# Patient Record
Sex: Male | Born: 1985 | Race: Black or African American | Hispanic: No | Marital: Single | Smoking: Current every day smoker
Health system: Southern US, Community
[De-identification: ages and names within clinical notes are randomized; demographics above are authoritative.]

## PROBLEM LIST (undated history)

## (undated) DIAGNOSIS — K5792 Diverticulitis of intestine, part unspecified, without perforation or abscess without bleeding: Secondary | ICD-10-CM

---

## 2014-03-02 ENCOUNTER — Encounter (HOSPITAL_BASED_OUTPATIENT_CLINIC_OR_DEPARTMENT_OTHER): Payer: Self-pay | Admitting: Emergency Medicine

## 2014-03-02 ENCOUNTER — Emergency Department (HOSPITAL_BASED_OUTPATIENT_CLINIC_OR_DEPARTMENT_OTHER)
Admission: EM | Admit: 2014-03-02 | Discharge: 2014-03-03 | Disposition: A | Discharge status: Home / Self Care | Payer: Self-pay | Attending: Emergency Medicine | Admitting: Emergency Medicine

## 2014-03-02 DIAGNOSIS — F17200 Nicotine dependence, unspecified, uncomplicated: Secondary | ICD-10-CM | POA: Insufficient documentation

## 2014-03-02 DIAGNOSIS — R197 Diarrhea, unspecified: Secondary | ICD-10-CM | POA: Insufficient documentation

## 2014-03-02 DIAGNOSIS — J069 Acute upper respiratory infection, unspecified: Secondary | ICD-10-CM | POA: Insufficient documentation

## 2014-03-02 NOTE — ED Notes (Signed)
C/o sneezing, prod cough, itching throat and eyes x 1 week-chills, last night

## 2014-03-03 ENCOUNTER — Emergency Department (HOSPITAL_BASED_OUTPATIENT_CLINIC_OR_DEPARTMENT_OTHER): Payer: Self-pay

## 2014-03-03 MED ORDER — HYDROCOD POLST-CHLORPHEN POLST 10-8 MG/5ML PO LQCR: 5.0000 mL | Freq: Two times a day (BID) | ORAL | Status: DC | PRN

## 2014-03-03 MED ORDER — HYDROCOD POLST-CHLORPHEN POLST 10-8 MG/5ML PO LQCR
ORAL | Status: AC
  Filled 2014-03-03: qty 5

## 2014-03-03 MED ORDER — HYDROCOD POLST-CHLORPHEN POLST 10-8 MG/5ML PO LQCR
5.0000 mL | Freq: Once | ORAL | Status: AC
  Administered 2014-03-03: 5 mL via ORAL

## 2014-03-03 NOTE — ED Provider Notes (Signed)
CSN: [id number]     Arrival date & time 03/02/14  2122 History   First MD Initiated Contact with Patient 03/03/14 0022     Chief Complaint  Patient presents with  . Allergy Symptoms      (Consider location/radiation/quality/duration/timing/severity/associated sxs/prior Treatment) HPI This is a 28 year old male who complains of a week of "allergy symptoms" by which he means rhinorrhea, nasal congestion, itchy throat and cough. He states the cough is productive of green sputum. Yesterday he developed vaguely described dizziness, weakness and chills. He is not aware of having a fever. He had some nausea earlier that has resolved. He had 4 episodes of diarrhea yesterday. He is taking Benadryl, Alka-Seltzer and Mucinex without relief. He denies ear pain, ear discomfort or hearing change.  History reviewed. No pertinent past medical history. History reviewed. No pertinent past surgical history. No family history on file. History  Substance Use Topics  . Smoking status: Current Every Day Smoker  . Smokeless tobacco: Not on file  . Alcohol Use: Yes    Review of Systems  All other systems reviewed and are negative.   Allergies  Review of patient's allergies indicates no known allergies.  Home Medications   Prior to Admission medications   Not on File   BP 154/88  Pulse 102  Temp(Src) 98.9 F (37.2 C) (Oral)  Resp 16  Ht 5\' 7"  (1.702 m)  Wt 275 lb (124.739 kg)  BMI 43.06 kg/m2  SpO2 100%  Physical Exam General: Well-developed, well-nourished male in no acute distress; appearance consistent with age of record HENT: normocephalic; atraumatic; mild nasal congestion; pharynx normal; TMs normal Eyes: pupils equal, round and reactive to light; extraocular muscles intact Neck: supple Heart: regular rate and rhythm Lungs: clear to auscultation bilaterally Abdomen: soft; nondistended; nontender; bowel sounds present Extremities: No deformity; full range of motion; pulses  normal Neurologic: Awake, alert and oriented; motor function intact in all extremities and symmetric; no facial droop Skin: Warm and dry Psychiatric: Normal mood and affect    ED Course  Procedures (including critical care time)  MDM  Nursing notes and vitals signs, including pulse oximetry, reviewed.  Summary of this visit's results, reviewed by myself:  Imaging Studies: Dg Chest 2 View  03/03/2014   CLINICAL DATA:  Cough  EXAM: CHEST  2 VIEW  COMPARISON:  None.  FINDINGS: The heart size and mediastinal contours are within normal limits. Both lungs are clear. The visualized skeletal structures are unremarkable.  IMPRESSION: No active cardiopulmonary disease.   Electronically Signed   By: Alcide CleverMark  Lukens M.D.   On: 03/03/2014 00:45   DDx seasonal allergies vs. Viral syndrome. Will treat symptomatically.      Hanley SeamenJohn L Aubery Douthat, MD 03/03/14 [phone]

## 2014-03-03 NOTE — ED Notes (Signed)
Pt reports allergic rhinitis-like symptoms x1 week and has tried OTC medications with minimal relief - pt also reports today he began experiencing weakness and diarrhea x4 episodes today. Pt admits to dizziness while standing for extended periods of time, denies syncope - dizziness relieved by sitting down.

## 2014-03-03 NOTE — ED Notes (Signed)
Pt ambulating independently w/ steady gait on d/c in no acute distress, A&Ox4. D/c instructions reviewed w/ pt and family - pt and family deny any further questions or concerns at present. Rx given x1  

## 2015-01-26 ENCOUNTER — Emergency Department (HOSPITAL_BASED_OUTPATIENT_CLINIC_OR_DEPARTMENT_OTHER): Payer: Self-pay

## 2015-01-26 ENCOUNTER — Emergency Department (HOSPITAL_BASED_OUTPATIENT_CLINIC_OR_DEPARTMENT_OTHER)
Admission: EM | Admit: 2015-01-26 | Discharge: 2015-01-26 | Disposition: A | Discharge status: Home / Self Care | Payer: Self-pay | Attending: Emergency Medicine | Admitting: Emergency Medicine

## 2015-01-26 ENCOUNTER — Encounter (HOSPITAL_BASED_OUTPATIENT_CLINIC_OR_DEPARTMENT_OTHER): Payer: Self-pay | Admitting: *Deleted

## 2015-01-26 DIAGNOSIS — Z720 Tobacco use: Secondary | ICD-10-CM | POA: Insufficient documentation

## 2015-01-26 DIAGNOSIS — R000 Tachycardia, unspecified: Secondary | ICD-10-CM | POA: Insufficient documentation

## 2015-01-26 DIAGNOSIS — J069 Acute upper respiratory infection, unspecified: Secondary | ICD-10-CM | POA: Insufficient documentation

## 2015-01-26 LAB — BASIC METABOLIC PANEL
ANION GAP: 9 (ref 5–15)
BUN: 18 mg/dL (ref 6–23)
CO2: 25 mmol/L (ref 19–32)
Calcium: 8.8 mg/dL (ref 8.4–10.5)
Chloride: 104 mmol/L (ref 96–112)
Creatinine, Ser: 1.38 mg/dL — ABNORMAL HIGH (ref 0.50–1.35)
GFR, EST AFRICAN AMERICAN: 79 mL/min — AB (ref >90)
GFR, EST NON AFRICAN AMERICAN: 68 mL/min — AB (ref >90)
GLUCOSE: 100 mg/dL — AB (ref 70–99)
POTASSIUM: 3.7 mmol/L (ref 3.5–5.1)
SODIUM: 138 mmol/L (ref 135–145)

## 2015-01-26 LAB — CBC
HEMATOCRIT: 44.6 % (ref 39.0–52.0)
HEMOGLOBIN: 14.6 g/dL (ref 13.0–17.0)
MCH: 26.6 pg (ref 26.0–34.0)
MCHC: 32.7 g/dL (ref 30.0–36.0)
MCV: 81.4 fL (ref 78.0–100.0)
Platelets: 189 10*3/uL (ref 150–400)
RBC: 5.48 MIL/uL (ref 4.22–5.81)
RDW: 15.2 % (ref 11.5–15.5)
WBC: 8.3 10*3/uL (ref 4.0–10.5)

## 2015-01-26 LAB — RAPID STREP SCREEN (MED CTR MEBANE ONLY): Streptococcus, Group A Screen (Direct): NEGATIVE

## 2015-01-26 MED ORDER — DEXTROSE 5 % IV SOLN
500.0000 mg | Freq: Once | INTRAVENOUS | Status: AC
  Administered 2015-01-26: 500 mg via INTRAVENOUS
  Filled 2015-01-26: qty 500

## 2015-01-26 MED ORDER — AZITHROMYCIN 250 MG PO TABS: ORAL_TABLET | ORAL | Status: AC

## 2015-01-26 MED ORDER — AZITHROMYCIN 250 MG PO TABS
ORAL_TABLET | ORAL | Status: AC
  Filled 2015-01-26: qty 1

## 2015-01-26 MED ORDER — SODIUM CHLORIDE 0.9 % IV BOLUS (SEPSIS)
1000.0000 mL | Freq: Once | INTRAVENOUS | Status: AC
  Administered 2015-01-26: 1000 mL via INTRAVENOUS

## 2015-01-26 MED ORDER — ACETAMINOPHEN 500 MG PO TABS
1000.0000 mg | ORAL_TABLET | Freq: Once | ORAL | Status: AC
  Administered 2015-01-26: 1000 mg via ORAL
  Filled 2015-01-26: qty 2

## 2015-01-26 MED ORDER — IBUPROFEN 800 MG PO TABS
800.0000 mg | ORAL_TABLET | Freq: Once | ORAL | Status: AC
  Administered 2015-01-26: 800 mg via ORAL

## 2015-01-26 MED ORDER — ACETAMINOPHEN 500 MG PO TABS: 1000.0000 mg | ORAL_TABLET | Freq: Once | ORAL | Status: DC

## 2015-01-26 MED ORDER — IBUPROFEN 800 MG PO TABS
ORAL_TABLET | ORAL | Status: AC
  Filled 2015-01-26: qty 1

## 2015-01-26 MED ORDER — AZITHROMYCIN 250 MG PO TABS
500.0000 mg | ORAL_TABLET | ORAL | Status: AC
  Administered 2015-01-26: 500 mg via ORAL
  Filled 2015-01-26: qty 2

## 2015-01-26 NOTE — Discharge Instructions (Signed)
Cough, Adult   A cough is a reflex. It helps you clear your throat and airways. A cough can help heal your body. A cough can last 2 or 3 weeks (acute) or may last more than 8 weeks (chronic). Some common causes of a cough can include an infection, allergy, or a cold.  HOME CARE  · Only take medicine as told by your doctor.  · If given, take your medicines (antibiotics) as told. Finish them even if you start to feel better.  · Use a cold steam vaporizer or humidifier in your home. This can help loosen thick spit (secretions).  · Sleep so you are almost sitting up (semi-upright). Use pillows to do this. This helps reduce coughing.  · Rest as needed.  · Stop smoking if you smoke.  GET HELP RIGHT AWAY IF:  · You have yellowish-white fluid (pus) in your thick spit.  · Your cough gets worse.  · Your medicine does not reduce coughing, and you are losing sleep.  · You cough up blood.  · You have trouble breathing.  · Your pain gets worse and medicine does not help.  · You have a fever.  MAKE SURE YOU:   · Understand these instructions.  · Will watch your condition.  · Will get help right away if you are not doing well or get worse.  Document Released: 06/19/2011 Document Revised: 02/20/2014 Document Reviewed: 06/19/2011  [employer]® Patient Information ©2015 [employer]. This information is not intended to replace advice given to you by your health care provider. Make sure you discuss any questions you have with your health care provider.

## 2015-01-26 NOTE — ED Notes (Signed)
Pt c/o URi symptoms  fever cough bodyaches x 4 days

## 2015-01-26 NOTE — ED Provider Notes (Signed)
CSN: [id number]     Arrival date & time 01/26/15  2012 History   First MD Initiated Contact with Patient 01/26/15 2017     Chief Complaint  Patient presents with  . URI     (Consider location/radiation/quality/duration/timing/severity/associated sxs/prior Treatment) HPI Comments: 29 year old male complaining of generalized body aches, subjective fever and chills, productive cough with mucus and sore throat 4 days. He tried taking over-the-counter NyQuil with minimal relief. Denies chest pain or shortness of breath. Admits to nausea without vomiting. Denies diarrhea. His child is sick with pneumonia, diagnosed last night and admitted to the hospital. States he feels fatigued.  Patient is a 29 y.o. male presenting with URI. The history is provided by the patient.  URI Presenting symptoms: congestion, cough, fatigue, rhinorrhea and sore throat   Cough:    Cough characteristics:  Productive   Sputum characteristics:  Nondescript   Severity:  Moderate   Onset quality:  Gradual   Duration:  4 days   Timing:  Constant   Progression:  Unchanged   Chronicity:  New Severity:  Moderate Onset quality:  Gradual Duration:  4 days Timing:  Constant Progression:  Worsening Chronicity:  New Relieved by:  Nothing Ineffective treatments: Nyquil. Associated symptoms: arthralgias and myalgias     History reviewed. No pertinent past medical history. History reviewed. No pertinent past surgical history. History reviewed. No pertinent family history. History  Substance Use Topics  . Smoking status: Current Every Day Smoker -- 1.00 packs/day    Types: Cigarettes  . Smokeless tobacco: Not on file  . Alcohol Use: Yes    Review of Systems  Constitutional: Positive for fatigue.  HENT: Positive for congestion, rhinorrhea and sore throat.   Respiratory: Positive for cough.   Gastrointestinal: Positive for nausea.  Musculoskeletal: Positive for myalgias and arthralgias.  All other systems  reviewed and are negative.     Allergies  Review of patient's allergies indicates no known allergies.  Home Medications   Prior to Admission medications   Medication Sig Start Date End Date Taking? Authorizing Provider  azithromycin (ZITHROMAX) 250 MG tablet Take 1 tablet by mouth daily for 4 days. 01/27/15 01/30/15  Purvis SheffieldForrest Harrison, MD   BP 124/71 mmHg  Pulse 103  Temp(Src) 100.3 F (37.9 C) (Oral)  Resp 18  Ht 5\' 7"  (1.702 m)  Wt 275 lb (124.739 kg)  BMI 43.06 kg/m2  SpO2 94% Physical Exam  Constitutional: He is oriented to person, place, and time. He appears well-developed and well-nourished. No distress.  HENT:  Head: Normocephalic and atraumatic.  Nose: Mucosal edema present.  Mouth/Throat: Uvula is midline and mucous membranes are normal. Posterior oropharyngeal edema and posterior oropharyngeal erythema present. No oropharyngeal exudate.  Eyes: Conjunctivae and EOM are normal.  Neck: Normal range of motion. Neck supple.  Cardiovascular: Regular rhythm and normal heart sounds.  Tachycardia present.   Pulmonary/Chest: Effort normal and breath sounds normal.  Harsh cough present.  Musculoskeletal: Normal range of motion. He exhibits no edema.  Lymphadenopathy:    He has cervical adenopathy.  Neurological: He is alert and oriented to person, place, and time.  Skin: Skin is warm and dry.  Psychiatric: He has a normal mood and affect. His behavior is normal.  Nursing note and vitals reviewed.   ED Course  Procedures (including critical care time) Labs Review Labs Reviewed  BASIC METABOLIC PANEL - Abnormal; Notable for the following:    Glucose, Bld 100 (*)    Creatinine, Ser 1.38 (*)  GFR calc non Af Amer 68 (*)    GFR calc Af Amer 79 (*)    All other components within normal limits  RAPID STREP SCREEN  CULTURE, GROUP A STREP  CBC    Imaging Review Dg Chest 2 View  01/26/2015   CLINICAL DATA:  Initial valuation for acute fever, cough, chest pain  EXAM:  CHEST  2 VIEW  COMPARISON:  Prior radiograph from 03/03/2014  FINDINGS: The cardiac and mediastinal silhouettes are stable in size and contour, and remain within normal limits.  The lungs are normally inflated. No airspace consolidation, pleural effusion, or pulmonary edema is identified. Streaky opacity within the retrocardiac left lower lobe favored to reflect normal bronchovascular markings. There is no pneumothorax.  No acute osseous abnormality identified.  IMPRESSION: No active cardiopulmonary disease.   Electronically Signed   By: Rise Mu M.D.   On: 01/26/2015 21:30     EKG Interpretation None      MDM   Final diagnoses:  URI (upper respiratory infection)   Patient presenting with flulike symptoms. Nontoxic appearing, NAD. Temperature 103.9, tachycardic, vitals otherwise stable. Lungs sound clear, however given significance of fever and harsh sounding cough, will obtain chest x-ray. Plan to give oral Tylenol, IV fluids and obtain basic labs. Will also check for strep.  Rapid strep negative. Labs that acute finding. Slight elevation of creatinine at 1.38. Chest x-ray clear.  Given that his child was diagnosed with pneumonia yesterday and admitted to the hospital, will cover with azithromycin. Patient still receiving IV fluids. Patient signed out to Dr. Romeo Apple at shift change. Plan to discharge patient home.  Kathrynn Speed, PA-C 01/27/15 1803  Purvis Sheffield, MD 01/28/15 1323

## 2015-01-29 LAB — CULTURE, GROUP A STREP: Strep A Culture: NEGATIVE

## 2015-06-27 ENCOUNTER — Emergency Department (HOSPITAL_BASED_OUTPATIENT_CLINIC_OR_DEPARTMENT_OTHER)
Admission: EM | Admit: 2015-06-27 | Discharge: 2015-06-27 | Disposition: A | Discharge status: Home / Self Care | Payer: Self-pay | Attending: Emergency Medicine | Admitting: Emergency Medicine

## 2015-06-27 ENCOUNTER — Encounter (HOSPITAL_BASED_OUTPATIENT_CLINIC_OR_DEPARTMENT_OTHER): Payer: Self-pay | Admitting: *Deleted

## 2015-06-27 DIAGNOSIS — Z720 Tobacco use: Secondary | ICD-10-CM | POA: Insufficient documentation

## 2015-06-27 DIAGNOSIS — R110 Nausea: Secondary | ICD-10-CM | POA: Insufficient documentation

## 2015-06-27 DIAGNOSIS — R42 Dizziness and giddiness: Secondary | ICD-10-CM | POA: Insufficient documentation

## 2015-06-27 DIAGNOSIS — R6883 Chills (without fever): Secondary | ICD-10-CM | POA: Insufficient documentation

## 2015-06-27 DIAGNOSIS — K047 Periapical abscess without sinus: Secondary | ICD-10-CM | POA: Insufficient documentation

## 2015-06-27 MED ORDER — PENICILLIN V POTASSIUM 500 MG PO TABS: 500.0000 mg | ORAL_TABLET | Freq: Four times a day (QID) | ORAL | Status: AC

## 2015-06-27 MED ORDER — LIDOCAINE-EPINEPHRINE 2 %-1:100000 IJ SOLN
20.0000 mL | Freq: Once | INTRAMUSCULAR | Status: AC
  Administered 2015-06-27: 10 mL
  Filled 2015-06-27: qty 1

## 2015-06-27 MED ORDER — OXYCODONE-ACETAMINOPHEN 5-325 MG PO TABS
2.0000 | ORAL_TABLET | Freq: Once | ORAL | Status: AC
  Administered 2015-06-27: 2 via ORAL
  Filled 2015-06-27: qty 2

## 2015-06-27 MED ORDER — BUPIVACAINE-EPINEPHRINE (PF) 0.5% -1:200000 IJ SOLN
1.8000 mL | Freq: Once | INTRAMUSCULAR | Status: AC
  Administered 2015-06-27: 1.8 mL

## 2015-06-27 MED ORDER — BUPIVACAINE-EPINEPHRINE (PF) 0.5% -1:200000 IJ SOLN
INTRAMUSCULAR | Status: AC
  Administered 2015-06-27: 1.8 mL
  Filled 2015-06-27: qty 1.8

## 2015-06-27 NOTE — Discharge Instructions (Signed)

## 2015-06-27 NOTE — ED Notes (Signed)
Pt reports left jaw swollen x 1 day- states left lower dental pain last night- has dentist appt on monday

## 2015-06-27 NOTE — ED Provider Notes (Signed)
CSN: [id number]     Arrival date & time 06/27/15  1934 History  This chart was scribed for Mirian Mo, MD by Tanda Rockers, ED Scribe. This patient was seen in room MHFT1/MHFT1 and the patient's care was started at 8:21 PM.  Chief Complaint  Patient presents with  . Facial Swelling   Patient is a 29 y.o. male presenting with tooth pain. The history is provided by the patient. No language interpreter was used.  Dental Pain Location:  Lower Quality:  Unable to specify Severity:  Moderate Onset quality:  Sudden Duration:  1 day Timing:  Constant Chronicity:  New Associated symptoms: facial swelling   Associated symptoms: no fever   Risk factors: smoking      HPI Comments: Javier Bowen is a 29 y.o. male who presents to the Emergency Department complaining of left jaw swelling x 1 day. He notes that he had left lower dental pain 1 day ago which he attributes the swelling to. Pt also complains of chills, dizziness, and nausea.  Denies fever, vomiting, diarrhea, constipation, abdominal pain, chest pain, shortness of breath, or any other associated symptoms. Pt has appointment with dentist on Monday, 07/02/2015 (5 days from now).   History reviewed. No pertinent past medical history. History reviewed. No pertinent past surgical history. No family history on file. Social History  Substance Use Topics  . Smoking status: Current Every Day Smoker -- 1.00 packs/day    Types: Cigarettes  . Smokeless tobacco: Never Used  . Alcohol Use: Yes     Comment: 5 drinks/weekends    Review of Systems  Constitutional: Positive for chills. Negative for fever.  HENT: Positive for dental problem (Left lower) and facial swelling.   Respiratory: Negative for shortness of breath.   Cardiovascular: Negative for chest pain.  Gastrointestinal: Positive for nausea. Negative for vomiting, abdominal pain, diarrhea and constipation.  Neurological: Positive for dizziness.  All other systems reviewed and are  negative.  Allergies  Review of patient's allergies indicates no known allergies.  Home Medications   Prior to Admission medications   Medication Sig Start Date End Date Taking? Authorizing Provider  penicillin v potassium (VEETID) 500 MG tablet Take 1 tablet (500 mg total) by mouth 4 (four) times daily. 06/27/15 07/04/15  Mirian Mo, MD   Triage Vitals: BP 143/93 mmHg  Pulse 122  Temp(Src) 98.5 F (36.9 C) (Oral)  Resp 20  Ht  (1.702 m)  Wt 307 lb (139.254 kg)  BMI 48.07 kg/m2  SpO2 98%   Physical Exam  Constitutional: He is oriented to person, place, and time. He appears well-developed and well-nourished.  HENT:  Head: Normocephalic and atraumatic.  L periapical abscess under 2nd Lower bicuspid  Eyes: Conjunctivae and EOM are normal.  Neck: Normal range of motion. Neck supple.  Cardiovascular: Normal rate, regular rhythm and normal heart sounds.   Pulmonary/Chest: Effort normal and breath sounds normal. No respiratory distress.  Abdominal: He exhibits no distension. There is no tenderness. There is no rebound and no guarding.  Musculoskeletal: Normal range of motion.  Neurological: He is alert and oriented to person, place, and time.  Skin: Skin is warm and dry.  Vitals reviewed.   ED Course  NERVE BLOCK Date/Time: 06/27/2015 11:24 PM Performed by: Mirian Mo Authorized by: Mirian Mo Consent: Verbal consent obtained. Indications: pain relief Body area: face/mouth Nerve: inferior alveolar Laterality: left Patient sedated: no Preparation: Patient was prepped and draped in the usual sterile fashion. Patient position: sitting Needle gauge: 25  G Location technique: anatomical landmarks Local anesthetic: bupivacaine 0.5% without epinephrine Anesthetic total: 1.8 ml Outcome: pain improved Patient tolerance: Patient tolerated the procedure well with no immediate complications  INCISION AND DRAINAGE Date/Time: 06/27/2015 11:25 PM Performed by: Mirian Mo Authorized by: Mirian Mo Consent: Verbal consent obtained. Type: abscess Body area: mouth (LL 2nd bicuspid periapical) Anesthesia: local infiltration and nerve block Local anesthetic: lidocaine 2% with epinephrine Scalpel size: 11 Incision type: single straight Incision depth: dermal Complexity: simple Drainage: bloody Drainage amount: scant Wound treatment: wound left open Patient tolerance: Patient tolerated the procedure well with no immediate complications   (including critical care time)  DIAGNOSTIC STUDIES: Oxygen Saturation is 98% on RA, normal by my interpretation.    COORDINATION OF CARE: 8:24 PM-Discussed treatment plan which includes dental block and I&D with pt at bedside and pt agreed to plan.   Labs Review Labs Reviewed - No data to display  Imaging Review No results found. I have personally reviewed and evaluated these images and lab results as part of my medical decision-making.   EKG Interpretation None      MDM   Final diagnoses:  Periapical abscess    29 y.o. male without pertinent PMH presents with periapical abscess as above.  Fluctuance noted on exam, very scant amount of blood obtained.  Afebrile.  DC home with dentistry fu, PCN.    I have reviewed all laboratory and imaging studies if ordered as above  1. Periapical abscess           Mirian Mo, MD 06/27/15 2328

## 2015-06-27 NOTE — ED Notes (Signed)
MD at bedside. 

## 2017-12-10 ENCOUNTER — Other Ambulatory Visit: Payer: Self-pay

## 2017-12-10 ENCOUNTER — Emergency Department (HOSPITAL_BASED_OUTPATIENT_CLINIC_OR_DEPARTMENT_OTHER)
Admission: EM | Admit: 2017-12-10 | Discharge: 2017-12-10 | Disposition: A | Discharge status: Home / Self Care | Payer: Self-pay | Attending: Emergency Medicine | Admitting: Emergency Medicine

## 2017-12-10 ENCOUNTER — Emergency Department (HOSPITAL_BASED_OUTPATIENT_CLINIC_OR_DEPARTMENT_OTHER): Payer: Self-pay

## 2017-12-10 ENCOUNTER — Encounter (HOSPITAL_BASED_OUTPATIENT_CLINIC_OR_DEPARTMENT_OTHER): Payer: Self-pay | Admitting: *Deleted

## 2017-12-10 DIAGNOSIS — J111 Influenza due to unidentified influenza virus with other respiratory manifestations: Secondary | ICD-10-CM | POA: Insufficient documentation

## 2017-12-10 DIAGNOSIS — F17210 Nicotine dependence, cigarettes, uncomplicated: Secondary | ICD-10-CM | POA: Insufficient documentation

## 2017-12-10 DIAGNOSIS — R6889 Other general symptoms and signs: Secondary | ICD-10-CM

## 2017-12-10 MED ORDER — GUAIFENESIN-CODEINE 100-10 MG/5ML PO SYRP: 5.0000 mL | ORAL_SOLUTION | Freq: Three times a day (TID) | ORAL | 0 refills | Status: DC | PRN

## 2017-12-10 MED ORDER — OSELTAMIVIR PHOSPHATE 75 MG PO CAPS: 75.0000 mg | ORAL_CAPSULE | Freq: Two times a day (BID) | ORAL | 0 refills | Status: DC

## 2017-12-10 NOTE — Discharge Instructions (Signed)
Chest x-ray was reassuring.  Please take Tylenol as needed for fever and 800 mg ibuprofen every 6 hours as needed for body aches and headache.

## 2017-12-10 NOTE — ED Triage Notes (Signed)
Cough and runny nose for 4 weeks. Chills, body aches and headache since last night. He has been taking OTC cold medications with no relief.

## 2017-12-10 NOTE — ED Provider Notes (Signed)
[facility] EMERGENCY DEPARTMENT Provider Note   CSN: [id number] Arrival date & time: 12/10/17  1112     History   Chief Complaint Chief Complaint  Patient presents with  . Cough    HPI Javier Bowen is a 32 y.o. male.  HPI   Javier Bowen is a 32 year old male with a history of tobacco use (10pk/yr) who presents to the emergency department for evaluation of fever, chills, cough, body aches, congestion and sore throat.  Patient states that he has had a cough for the past 3 weeks now.  Cough is productive of a green/orange mucus.  He endorses shortness of breath with coughing attacks, denies shortness of breath at rest. Last night he had a tactile fever with chills.  States that he also developed body aches over bilateral arms, legs and lower back.  Has a moderate bitemporal headache.  Endorses sharp sore throat with swallowing.  Denies known sick contacts.  States that he has tried taking over-the-counter NyQuil and Robitussin for cough with some relief.  He denies neck pain/stiffness, numbness, weakness, ear pain, dysphagia, trismus, chest pain, wheezing, abdominal pain, nausea/vomiting, dysuria.  Is otherwise eating and drinking normally.  History reviewed. No pertinent past medical history.  There are no active problems to display for this patient.   History reviewed. No pertinent surgical history.     Home Medications    Prior to Admission medications   Not on File    Family History No family history on file.  Social History Social History   Tobacco Use  . Smoking status: Current Every Day Smoker    Packs/day: 1.00    Types: Cigarettes  . Smokeless tobacco: Never Used  Substance Use Topics  . Alcohol use: Yes    Comment: 5 drinks/weekends  . Drug use: Yes    Types: Marijuana     Allergies   Patient has no known allergies.   Review of Systems Review of Systems  Constitutional: Positive for chills, fatigue and fever.  HENT: Positive for  congestion, rhinorrhea and sore throat. Negative for ear pain and trouble swallowing.   Respiratory: Positive for cough and shortness of breath (with coughing attacks). Negative for wheezing.   Cardiovascular: Negative for chest pain and leg swelling.  Gastrointestinal: Negative for abdominal pain, nausea and vomiting.  Genitourinary: Negative for dysuria and frequency.  Musculoskeletal: Positive for myalgias.  Skin: Negative for rash.  Neurological: Positive for headaches. Negative for dizziness, weakness, light-headedness and numbness.     Physical Exam Updated Vital Signs BP 140/69 (BP Location: Right Arm)   Pulse 88   Temp 98.2 F (36.8 C) (Oral)   Resp 18   Ht 5\' 7"  (1.702 m)   Wt (!) 139.3 kg (307 lb)   SpO2 95%   BMI 48.08 kg/m   Physical Exam  Constitutional: He is oriented to person, place, and time. He appears well-developed and well-nourished. No distress.  HENT:  Head: Normocephalic and atraumatic.  Mucous membranes moist.  Posterior oropharynx without erythema.  No tonsillar edema or exudate.  Uvula midline.  No trismus.  Airway patent.  Able to handle oral secretions.  No tenderness over maxillary or frontal sinuses.  Eyes: Conjunctivae are normal. Pupils are equal, round, and reactive to light. Right eye exhibits no discharge. Left eye exhibits no discharge.  Neck: Normal range of motion. Neck supple.  Cardiovascular: Normal rate and regular rhythm. Exam reveals no friction rub.  No murmur heard. Pulmonary/Chest: Effort normal and breath sounds normal.  No stridor. No respiratory distress. He has no wheezes. He has no rales.  Abdominal: Soft. Bowel sounds are normal. There is no tenderness. There is no guarding.  Musculoskeletal: Normal range of motion.  No leg swelling or calf tenderness.  Lymphadenopathy:    He has no cervical adenopathy.  Neurological: He is alert and oriented to person, place, and time. Coordination normal.  Skin: Skin is warm and dry.  Capillary refill takes less than 2 seconds. He is not diaphoretic.  Psychiatric: He has a normal mood and affect. His behavior is normal.  Nursing note and vitals reviewed.    ED Treatments / Results  Labs (all labs ordered are listed, but only abnormal results are displayed) Labs Reviewed - No data to display  EKG  EKG Interpretation None       Radiology Dg Chest 2 View  Result Date: 12/10/2017 CLINICAL DATA:  Cough and congestion with shortness of breath EXAM: CHEST  2 VIEW COMPARISON:  January 26, 2015 FINDINGS: Lungs are clear. Heart size and pulmonary vascularity are normal. No adenopathy. No bone lesions. IMPRESSION: No edema or consolidation. Electronically Signed   By: Bretta BangWilliam  Woodruff III M.D.   On: 12/10/2017 12:22    Procedures Procedures (including critical care time)  Medications Ordered in ED Medications - No data to display   Initial Impression / Assessment and Plan / ED Course  I have reviewed the triage vital signs and the nursing notes.  Pertinent labs & imaging results that were available during my care of the patient were reviewed by me and considered in my medical decision making (see chart for details).    Patient with symptoms consistent with influenza.  Vitals are stable.  No signs of dehydration, tolerating PO's.  Lungs are clear. CXR without acute abnormality.  Discussed the cost versus benefit of Tamiflu treatment with the patient who elects to treat his symptoms symptomatically.  Patient will be discharged with instructions to orally hydrate, rest, and use over-the-counter medications such as anti-inflammatories ibuprofen and Aleve for muscle aches and Tylenol for fever.  Patient will also be given a cough suppressant.   Final Clinical Impressions(s) / ED Diagnoses   Final diagnoses:  Flu-like symptoms    ED Discharge Orders        Ordered    guaiFENesin-codeine [facility](ROBITUSSIN AC) 100-10 MG/5ML syrup  3 times daily PRN     12/10/17 1355         Lawrence MarseillesShrosbree, Emily J, PA-C 12/10/17 1355    Alvira MondaySchlossman, Erin, MD 12/10/17 2324

## 2017-12-10 NOTE — ED Notes (Signed)
Patient transported to X-ray 

## 2019-10-27 ENCOUNTER — Ambulatory Visit: Payer: Self-pay

## 2019-12-23 ENCOUNTER — Emergency Department (HOSPITAL_BASED_OUTPATIENT_CLINIC_OR_DEPARTMENT_OTHER): Payer: Self-pay

## 2019-12-23 ENCOUNTER — Other Ambulatory Visit: Payer: Self-pay

## 2019-12-23 ENCOUNTER — Encounter (HOSPITAL_BASED_OUTPATIENT_CLINIC_OR_DEPARTMENT_OTHER): Payer: Self-pay | Admitting: Emergency Medicine

## 2019-12-23 ENCOUNTER — Emergency Department (HOSPITAL_BASED_OUTPATIENT_CLINIC_OR_DEPARTMENT_OTHER)
Admission: EM | Admit: 2019-12-23 | Discharge: 2019-12-23 | Disposition: A | Discharge status: Home / Self Care | Payer: Self-pay | Attending: Emergency Medicine | Admitting: Emergency Medicine

## 2019-12-23 DIAGNOSIS — K5732 Diverticulitis of large intestine without perforation or abscess without bleeding: Secondary | ICD-10-CM | POA: Insufficient documentation

## 2019-12-23 DIAGNOSIS — Z20822 Contact with and (suspected) exposure to covid-19: Secondary | ICD-10-CM | POA: Insufficient documentation

## 2019-12-23 DIAGNOSIS — K5792 Diverticulitis of intestine, part unspecified, without perforation or abscess without bleeding: Secondary | ICD-10-CM

## 2019-12-23 DIAGNOSIS — F17210 Nicotine dependence, cigarettes, uncomplicated: Secondary | ICD-10-CM | POA: Insufficient documentation

## 2019-12-23 HISTORY — DX: Morbid (severe) obesity due to excess calories: E66.01

## 2019-12-23 LAB — LIPASE, BLOOD: Lipase: 22 U/L (ref 11–51)

## 2019-12-23 LAB — CBC WITH DIFFERENTIAL/PLATELET
Abs Immature Granulocytes: 0.08 10*3/uL — ABNORMAL HIGH (ref 0.00–0.07)
Basophils Absolute: 0 10*3/uL (ref 0.0–0.1)
Basophils Relative: 0 %
Eosinophils Absolute: 0.1 10*3/uL (ref 0.0–0.5)
Eosinophils Relative: 1 %
HCT: 46.9 % (ref 39.0–52.0)
Hemoglobin: 15.5 g/dL (ref 13.0–17.0)
Immature Granulocytes: 1 %
Lymphocytes Relative: 27 %
Lymphs Abs: 3.8 10*3/uL (ref 0.7–4.0)
MCH: 28.1 pg (ref 26.0–34.0)
MCHC: 33 g/dL (ref 30.0–36.0)
MCV: 85.1 fL (ref 80.0–100.0)
Monocytes Absolute: 1.5 10*3/uL — ABNORMAL HIGH (ref 0.1–1.0)
Monocytes Relative: 10 %
Neutro Abs: 8.6 10*3/uL — ABNORMAL HIGH (ref 1.7–7.7)
Neutrophils Relative %: 61 %
Platelets: 205 10*3/uL (ref 150–400)
RBC: 5.51 MIL/uL (ref 4.22–5.81)
RDW: 14.7 % (ref 11.5–15.5)
WBC: 14.1 10*3/uL — ABNORMAL HIGH (ref 4.0–10.5)
nRBC: 0 % (ref 0.0–0.2)

## 2019-12-23 LAB — COMPREHENSIVE METABOLIC PANEL
ALT: 30 U/L (ref 0–44)
AST: 25 U/L (ref 15–41)
Albumin: 4 g/dL (ref 3.5–5.0)
Alkaline Phosphatase: 45 U/L (ref 38–126)
Anion gap: 11 (ref 5–15)
BUN: 11 mg/dL (ref 6–20)
CO2: 21 mmol/L — ABNORMAL LOW (ref 22–32)
Calcium: 9 mg/dL (ref 8.9–10.3)
Chloride: 103 mmol/L (ref 98–111)
Creatinine, Ser: 1.1 mg/dL (ref 0.61–1.24)
GFR calc Af Amer: >60 mL/min (ref >60)
GFR calc non Af Amer: >60 mL/min (ref >60)
Glucose, Bld: 110 mg/dL — ABNORMAL HIGH (ref 70–99)
Potassium: 4 mmol/L (ref 3.5–5.1)
Sodium: 135 mmol/L (ref 135–145)
Total Bilirubin: 0.5 mg/dL (ref 0.3–1.2)
Total Protein: 7.6 g/dL (ref 6.5–8.1)

## 2019-12-23 LAB — URINALYSIS, ROUTINE W REFLEX MICROSCOPIC
Bilirubin Urine: NEGATIVE
Glucose, UA: NEGATIVE mg/dL
Ketones, ur: NEGATIVE mg/dL
Leukocytes,Ua: NEGATIVE
Nitrite: NEGATIVE
Protein, ur: NEGATIVE mg/dL
Specific Gravity, Urine: 1.025 (ref 1.005–1.030)
pH: 6 (ref 5.0–8.0)

## 2019-12-23 LAB — URINALYSIS, MICROSCOPIC (REFLEX)

## 2019-12-23 LAB — SARS CORONAVIRUS 2 AG (30 MIN TAT): SARS Coronavirus 2 Ag: NEGATIVE

## 2019-12-23 MED ORDER — KETOROLAC TROMETHAMINE 30 MG/ML IJ SOLN
30.0000 mg | Freq: Once | INTRAMUSCULAR | Status: AC
  Administered 2019-12-23: 10:00:00 30 mg via INTRAVENOUS
  Filled 2019-12-23: qty 1

## 2019-12-23 MED ORDER — METRONIDAZOLE 500 MG PO TABS
500.0000 mg | ORAL_TABLET | Freq: Once | ORAL | Status: AC
  Administered 2019-12-23: 12:00:00 500 mg via ORAL
  Filled 2019-12-23: qty 1

## 2019-12-23 MED ORDER — CIPROFLOXACIN HCL 500 MG PO TABS
500.0000 mg | ORAL_TABLET | Freq: Once | ORAL | Status: AC
  Administered 2019-12-23: 12:00:00 500 mg via ORAL
  Filled 2019-12-23: qty 1

## 2019-12-23 MED ORDER — METRONIDAZOLE 500 MG PO TABS: 500.0000 mg | ORAL_TABLET | Freq: Two times a day (BID) | ORAL | 0 refills | Status: DC

## 2019-12-23 MED ORDER — HYDROCODONE-ACETAMINOPHEN 5-325 MG PO TABS: 1.0000 | ORAL_TABLET | ORAL | 0 refills | Status: DC | PRN

## 2019-12-23 MED ORDER — ONDANSETRON 4 MG PO TBDP: 4.0000 mg | ORAL_TABLET | Freq: Three times a day (TID) | ORAL | 0 refills | Status: DC | PRN

## 2019-12-23 MED ORDER — CIPROFLOXACIN HCL 500 MG PO TABS: 500.0000 mg | ORAL_TABLET | Freq: Two times a day (BID) | ORAL | 0 refills | Status: DC

## 2019-12-23 MED ORDER — SODIUM CHLORIDE 0.9 % IV BOLUS
1000.0000 mL | Freq: Once | INTRAVENOUS | Status: AC
  Administered 2019-12-23: 1000 mL via INTRAVENOUS

## 2019-12-23 MED ORDER — IOHEXOL 300 MG/ML  SOLN
100.0000 mL | Freq: Once | INTRAMUSCULAR | Status: AC | PRN
  Administered 2019-12-23: 11:00:00 100 mL via INTRAVENOUS

## 2019-12-23 NOTE — ED Notes (Signed)
ED Provider at bedside. 

## 2019-12-23 NOTE — ED Triage Notes (Signed)
Lower abdominal pain, chills, sweats, and diarrhea x3 days.

## 2019-12-23 NOTE — ED Provider Notes (Signed)
[facility] EMERGENCY DEPARTMENT Provider Note   CSN: [id number] Arrival date & time: 12/23/19  4193     History Chief Complaint  Patient presents with  . Abdominal Pain    Javier Bowen is a 34 y.o. male.  Pt presents to the ED today with abdominal pain.  Pt said he's had body aches as well.  No cough.  No fevers.  He's had chills and some diarrhea.  No known covid exposures.  He has not taken anything for his sx.        Past Medical History:  Diagnosis Date  . Morbid obesity (HCC)     There are no problems to display for this patient.   History reviewed. No pertinent surgical history.     No family history on file.  Social History   Tobacco Use  . Smoking status: Current Every Day Smoker    Packs/day: 1.00    Types: Cigarettes  . Smokeless tobacco: Never Used  Substance Use Topics  . Alcohol use: Yes    Comment: 5 drinks/weekends  . Drug use: Not Currently    Home Medications Prior to Admission medications   Medication Sig Start Date End Date Taking? Authorizing Provider  ciprofloxacin (CIPRO) 500 MG tablet Take 1 tablet (500 mg total) by mouth 2 (two) times daily. 12/23/19   Jacalyn Lefevre, MD  guaiFENesin-codeine (ROBITUSSIN AC) 100-10 MG/5ML syrup Take 5 mLs by mouth 3 (three) times daily as needed for cough. 12/10/17   Kellie Shropshire, PA-C  HYDROcodone-acetaminophen (NORCO/VICODIN) 5-325 MG tablet Take 1 tablet by mouth every 4 (four) hours as needed. 12/23/19   Jacalyn Lefevre, MD  metroNIDAZOLE (FLAGYL) 500 MG tablet Take 1 tablet (500 mg total) by mouth 2 (two) times daily. 12/23/19   Jacalyn Lefevre, MD  ondansetron (ZOFRAN ODT) 4 MG disintegrating tablet Take 1 tablet (4 mg total) by mouth every 8 (eight) hours as needed. 12/23/19   Jacalyn Lefevre, MD  oseltamivir (TAMIFLU) 75 MG capsule Take 1 capsule (75 mg total) by mouth every 12 (twelve) hours. 12/10/17   Alvira Monday, MD    Allergies    Patient has no known allergies.  Review of  Systems   Review of Systems  Constitutional: Positive for chills.  Gastrointestinal: Positive for abdominal pain and diarrhea.  Musculoskeletal: Positive for myalgias.  All other systems reviewed and are negative.   Physical Exam Updated Vital Signs BP 111/82 (BP Location: Left Arm)   Pulse 84   Temp 98.5 F (36.9 C) (Oral)   Resp 20   Ht 5\' 7"  (1.702 m)   Wt (!) 164.7 kg   SpO2 100%   BMI 56.85 kg/m   Physical Exam Vitals and nursing note reviewed.  Constitutional:      Appearance: He is well-developed. He is obese.  HENT:     Head: Normocephalic and atraumatic.     Mouth/Throat:     Mouth: Mucous membranes are moist.     Pharynx: Oropharynx is clear.  Eyes:     Extraocular Movements: Extraocular movements intact.     Pupils: Pupils are equal, round, and reactive to light.  Cardiovascular:     Rate and Rhythm: Regular rhythm. Tachycardia present.     Heart sounds: Normal heart sounds.  Pulmonary:     Effort: Pulmonary effort is normal.     Breath sounds: Normal breath sounds.  Abdominal:     General: Abdomen is flat. Bowel sounds are normal.     Palpations: Abdomen is  soft.     Tenderness: There is generalized abdominal tenderness.  Skin:    General: Skin is warm.     Capillary Refill: Capillary refill takes less than 2 seconds.  Neurological:     General: No focal deficit present.     Mental Status: He is alert and oriented to person, place, and time.  Psychiatric:        Mood and Affect: Mood normal.        Behavior: Behavior normal.     ED Results / Procedures / Treatments   Labs (all labs ordered are listed, but only abnormal results are displayed) Labs Reviewed  CBC WITH DIFFERENTIAL/PLATELET - Abnormal; Notable for the following components:      Result Value   WBC 14.1 (*)    Neutro Abs 8.6 (*)    Monocytes Absolute 1.5 (*)    Abs Immature Granulocytes 0.08 (*)    All other components within normal limits  COMPREHENSIVE METABOLIC PANEL -  Abnormal; Notable for the following components:   CO2 21 (*)    Glucose, Bld 110 (*)    All other components within normal limits  URINALYSIS, ROUTINE W REFLEX MICROSCOPIC - Abnormal; Notable for the following components:   Hgb urine dipstick MODERATE (*)    All other components within normal limits  URINALYSIS, MICROSCOPIC (REFLEX) - Abnormal; Notable for the following components:   Bacteria, UA MANY (*)    All other components within normal limits  SARS CORONAVIRUS 2 AG (30 MIN TAT)  LIPASE, BLOOD    EKG None  Radiology CT ABDOMEN PELVIS W CONTRAST  Result Date: 12/23/2019 CLINICAL DATA:  34 year old male with history of acute onset of abdominal pain. EXAM: CT ABDOMEN AND PELVIS WITH CONTRAST TECHNIQUE: Multidetector CT imaging of the abdomen and pelvis was performed using the standard protocol following bolus administration of intravenous contrast. CONTRAST:  OMNIPAQUE IOHEXOL 300 MG/ML  SOLN COMPARISON:  No priors. FINDINGS: Lower chest: Unremarkable. Hepatobiliary: No suspicious cystic or solid hepatic lesions. No intra or extrahepatic biliary ductal dilatation. Gallbladder is normal in appearance. Pancreas: No pancreatic mass. No pancreatic ductal dilatation. No pancreatic or peripancreatic fluid collections or inflammatory changes. Spleen: Unremarkable. Adrenals/Urinary Tract: Bilateral kidneys and adrenal glands are normal in appearance. No hydroureteronephrosis. Urinary bladder is normal in appearance. Stomach/Bowel: Normal appearance of the stomach. No pathologic dilatation of small bowel or colon. A few scattered colonic diverticulae are noted. In the sigmoid mesocolon around a diverticulum there are extensive inflammatory changes, compatible with acute diverticulitis at this time. No discrete diverticular abscess or signs of frank perforation are noted at this time. Normal appendix. Vascular/Lymphatic: Aortic atherosclerosis, without evidence of aneurysm or dissection noted in  the abdominal or pelvic vasculature. No lymphadenopathy noted in the abdomen or pelvis. Reproductive: Prostate gland and seminal vesicles are unremarkable in appearance. Other: No significant volume of ascites.  No pneumoperitoneum. Musculoskeletal: There are no aggressive appearing lytic or blastic lesions noted in the visualized portions of the skeleton. IMPRESSION: 1. Acute sigmoid diverticulitis, without diverticular abscess or signs of frank perforation, as detailed above. Electronically Signed   By: Trudie Reed M.D.   On: 12/23/2019 11:50   DG Chest Portable 1 View  Result Date: 12/23/2019 CLINICAL DATA:  Cough, abdominal pain EXAM: PORTABLE CHEST 1 VIEW COMPARISON:  12/10/2017 FINDINGS: The heart size and mediastinal contours are within normal limits. No focal airspace consolidation, pleural effusion, or pneumothorax. The visualized skeletal structures are unremarkable. IMPRESSION: No acute cardiopulmonary findings. Electronically Signed  By: Davina Poke D.O.   On: 12/23/2019 09:20    Procedures Procedures (including critical care time)  Medications Ordered in ED Medications  ciprofloxacin (CIPRO) tablet 500 mg (has no administration in time range)  metroNIDAZOLE (FLAGYL) tablet 500 mg (has no administration in time range)  sodium chloride 0.9 % bolus 1,000 mL (0 mLs Intravenous Stopped 12/23/19 1101)  ketorolac (TORADOL) 30 MG/ML injection 30 mg (30 mg Intravenous Given 12/23/19 0947)  iohexol (OMNIPAQUE) 300 MG/ML solution 100 mL (100 mLs Intravenous Contrast Given 12/23/19 1110)    ED Course  I have reviewed the triage vital signs and the nursing notes.  Pertinent labs & imaging results that were available during my care of the patient were reviewed by me and considered in my medical decision making (see chart for details).    MDM Rules/Calculators/A&P                      Pt is feeling better.  He does have diverticulitis which will be treated with cipro/flagyl.  Pt knows  to return if sx worsen.  F/u with pcp.  Final Clinical Impression(s) / ED Diagnoses Final diagnoses:  Diverticulitis    Rx / DC Orders ED Discharge Orders         Ordered    ciprofloxacin (CIPRO) 500 MG tablet  2 times daily     12/23/19 1156    metroNIDAZOLE (FLAGYL) 500 MG tablet  2 times daily     12/23/19 1156    HYDROcodone-acetaminophen (NORCO/VICODIN) 5-325 MG tablet  Every 4 hours PRN     12/23/19 1156    ondansetron (ZOFRAN ODT) 4 MG disintegrating tablet  Every 8 hours PRN     12/23/19 1156           Isla Pence, MD 12/23/19 1157

## 2020-11-28 IMAGING — DX DG CHEST 1V PORT
1 series · 1 of 1 positions shown · non-contrast
Comparison: 12/10/2017

CLINICAL DATA: Cough, abdominal pain

EXAM:
PORTABLE CHEST 1 VIEW

[chest ap]
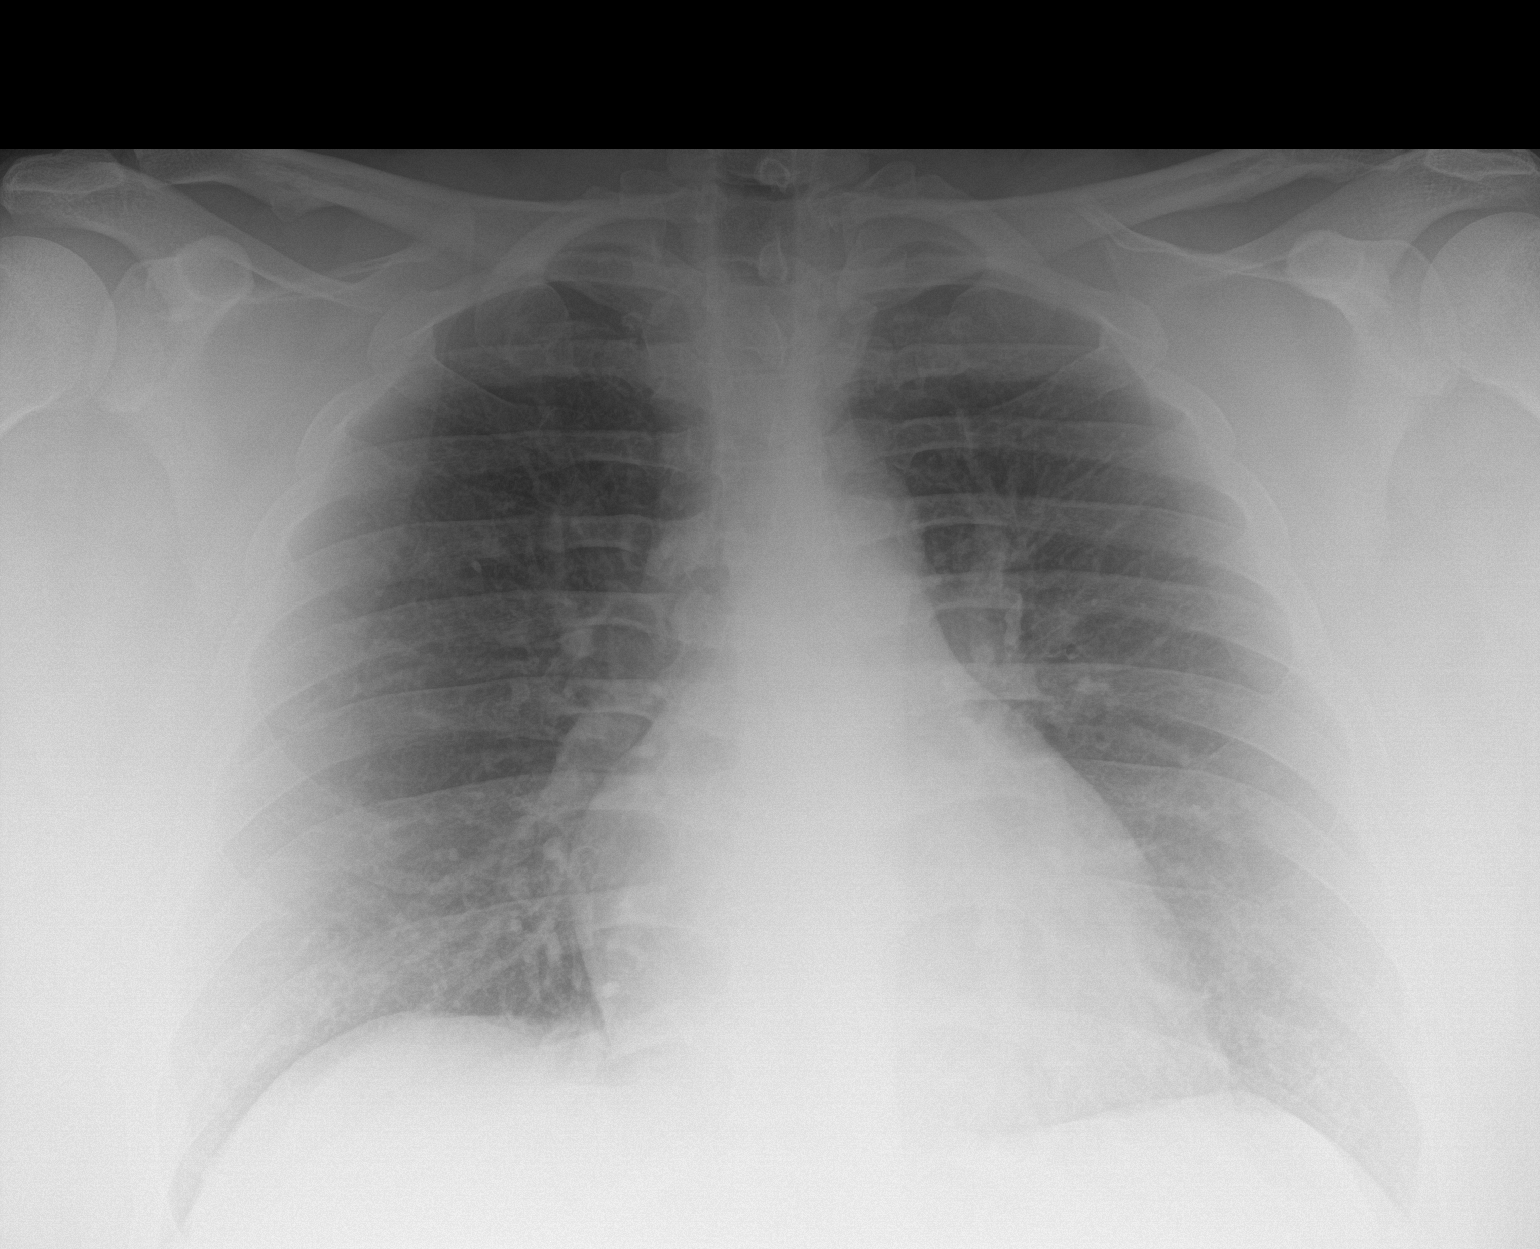

[1 of 1 positions shown; findings below may reference images not displayed]

FINDINGS: The heart size and mediastinal contours are within normal limits. No
focal airspace consolidation, pleural effusion, or pneumothorax. The
visualized skeletal structures are unremarkable.
IMPRESSION: No acute cardiopulmonary findings.

## 2021-04-20 ENCOUNTER — Other Ambulatory Visit: Payer: Self-pay

## 2021-04-20 ENCOUNTER — Emergency Department (HOSPITAL_BASED_OUTPATIENT_CLINIC_OR_DEPARTMENT_OTHER): Payer: 59

## 2021-04-20 ENCOUNTER — Emergency Department (HOSPITAL_BASED_OUTPATIENT_CLINIC_OR_DEPARTMENT_OTHER)
Admission: EM | Admit: 2021-04-20 | Discharge: 2021-04-20 | Disposition: A | Discharge status: Home / Self Care | Payer: 59 | Attending: Emergency Medicine | Admitting: Emergency Medicine

## 2021-04-20 ENCOUNTER — Encounter (HOSPITAL_BASED_OUTPATIENT_CLINIC_OR_DEPARTMENT_OTHER): Payer: Self-pay | Admitting: Emergency Medicine

## 2021-04-20 DIAGNOSIS — F17210 Nicotine dependence, cigarettes, uncomplicated: Secondary | ICD-10-CM | POA: Insufficient documentation

## 2021-04-20 DIAGNOSIS — R5381 Other malaise: Secondary | ICD-10-CM | POA: Insufficient documentation

## 2021-04-20 DIAGNOSIS — K5792 Diverticulitis of intestine, part unspecified, without perforation or abscess without bleeding: Secondary | ICD-10-CM | POA: Diagnosis not present

## 2021-04-20 DIAGNOSIS — M791 Myalgia, unspecified site: Secondary | ICD-10-CM | POA: Insufficient documentation

## 2021-04-20 DIAGNOSIS — R519 Headache, unspecified: Secondary | ICD-10-CM | POA: Insufficient documentation

## 2021-04-20 DIAGNOSIS — R1010 Upper abdominal pain, unspecified: Secondary | ICD-10-CM | POA: Diagnosis present

## 2021-04-20 LAB — CBC WITH DIFFERENTIAL/PLATELET
Abs Immature Granulocytes: 0 10*3/uL (ref 0.00–0.07)
Basophils Absolute: 0.2 10*3/uL — ABNORMAL HIGH (ref 0.0–0.1)
Basophils Relative: 1 %
Eosinophils Absolute: 0.3 10*3/uL (ref 0.0–0.5)
Eosinophils Relative: 2 %
HCT: 44.5 % (ref 39.0–52.0)
Hemoglobin: 14.5 g/dL (ref 13.0–17.0)
Lymphocytes Relative: 35 %
Lymphs Abs: 5.9 10*3/uL — ABNORMAL HIGH (ref 0.7–4.0)
MCH: 27.4 pg (ref 26.0–34.0)
MCHC: 32.6 g/dL (ref 30.0–36.0)
MCV: 84.1 fL (ref 80.0–100.0)
Monocytes Absolute: 1.4 10*3/uL — ABNORMAL HIGH (ref 0.1–1.0)
Monocytes Relative: 8 %
Neutro Abs: 9.1 10*3/uL — ABNORMAL HIGH (ref 1.7–7.7)
Neutrophils Relative %: 54 %
Platelets: 235 10*3/uL (ref 150–400)
RBC: 5.29 MIL/uL (ref 4.22–5.81)
RDW: 15.4 % (ref 11.5–15.5)
WBC: 16.9 10*3/uL — ABNORMAL HIGH (ref 4.0–10.5)
nRBC: 0 % (ref 0.0–0.2)

## 2021-04-20 LAB — COMPREHENSIVE METABOLIC PANEL
ALT: 27 U/L (ref 0–44)
AST: 21 U/L (ref 15–41)
Albumin: 4 g/dL (ref 3.5–5.0)
Alkaline Phosphatase: 53 U/L (ref 38–126)
Anion gap: 9 (ref 5–15)
BUN: 10 mg/dL (ref 6–20)
CO2: 26 mmol/L (ref 22–32)
Calcium: 9.3 mg/dL (ref 8.9–10.3)
Chloride: 102 mmol/L (ref 98–111)
Creatinine, Ser: 1.13 mg/dL (ref 0.61–1.24)
GFR, Estimated: >60 mL/min (ref >60)
Glucose, Bld: 113 mg/dL — ABNORMAL HIGH (ref 70–99)
Potassium: 3.9 mmol/L (ref 3.5–5.1)
Sodium: 137 mmol/L (ref 135–145)
Total Bilirubin: 0.2 mg/dL — ABNORMAL LOW (ref 0.3–1.2)
Total Protein: 7.3 g/dL (ref 6.5–8.1)

## 2021-04-20 LAB — URINALYSIS, MICROSCOPIC (REFLEX)

## 2021-04-20 LAB — URINALYSIS, ROUTINE W REFLEX MICROSCOPIC
Bilirubin Urine: NEGATIVE
Glucose, UA: NEGATIVE mg/dL
Ketones, ur: NEGATIVE mg/dL
Leukocytes,Ua: NEGATIVE
Nitrite: NEGATIVE
Protein, ur: NEGATIVE mg/dL
Specific Gravity, Urine: 1.025 (ref 1.005–1.030)
pH: 6 (ref 5.0–8.0)

## 2021-04-20 LAB — LIPASE, BLOOD: Lipase: 29 U/L (ref 11–51)

## 2021-04-20 MED ORDER — ONDANSETRON HCL 4 MG/2ML IJ SOLN: 4.0000 mg | Freq: Once | INTRAMUSCULAR | Status: DC

## 2021-04-20 MED ORDER — AMOXICILLIN-POT CLAVULANATE 875-125 MG PO TABS: 1.0000 | ORAL_TABLET | Freq: Two times a day (BID) | ORAL | 0 refills | Status: DC

## 2021-04-20 MED ORDER — AMOXICILLIN-POT CLAVULANATE 875-125 MG PO TABS
1.0000 | ORAL_TABLET | Freq: Once | ORAL | Status: AC
  Administered 2021-04-20: 1 via ORAL
  Filled 2021-04-20: qty 1

## 2021-04-20 MED ORDER — OXYCODONE-ACETAMINOPHEN 5-325 MG PO TABS: 1.0000 | ORAL_TABLET | Freq: Four times a day (QID) | ORAL | 0 refills | Status: DC | PRN

## 2021-04-20 MED ORDER — FENTANYL CITRATE (PF) 100 MCG/2ML IJ SOLN
100.0000 ug | Freq: Once | INTRAMUSCULAR | Status: AC
  Administered 2021-04-20: 100 ug via INTRAVENOUS
  Filled 2021-04-20: qty 2

## 2021-04-20 MED ORDER — IOHEXOL 300 MG/ML  SOLN
100.0000 mL | Freq: Once | INTRAMUSCULAR | Status: AC | PRN
  Administered 2021-04-20: 100 mL via INTRAVENOUS

## 2021-04-20 MED ORDER — ONDANSETRON HCL 4 MG/2ML IJ SOLN
4.0000 mg | Freq: Once | INTRAMUSCULAR | Status: AC
  Administered 2021-04-20: 4 mg via INTRAVENOUS
  Filled 2021-04-20: qty 2

## 2021-04-20 NOTE — ED Triage Notes (Signed)
Pt states he is having abd pain that radiates around to his back, headaches, fatigue, joint pain, body aches  Sxs started 3 days ago but have gotten worse  Denies N/V/D

## 2021-04-20 NOTE — ED Provider Notes (Signed)
[facility] Provider Note: Lowella Dell, MD, FACEP  CSN: [id number] MRN: [id number] ARRIVAL: 04/20/21 at 0154 ROOM: [facility]   CHIEF COMPLAINT  Abdominal Pain   HISTORY OF PRESENT ILLNESS  04/20/21 4:20 AM Javier Bowen is a 35 y.o. male with abdominal pain that started 3 days ago and worsen.  The abdominal pain is located in his upper abdomen, all the way across.  He is also having pain in his lower back.  He rates the pain as a 9 out of 10 and it has both aching and cramping components.  It is somewhat worse with movement and palpation of his abdomen.  He has also had generalized malaise body aches and headache.  He is not having nausea or vomiting.  He thinks he may have had diarrhea recently.  He was noted to have a low-grade fever on arrival and on recheck just now it was 100.1.   Past Medical History:  Diagnosis Date   Morbid obesity (HCC)     History reviewed. No pertinent surgical history.  Family History  Problem Relation Age of Onset   Diabetes Mother     Social History   Tobacco Use   Smoking status: Every Day    Packs/day: 1.00    Pack years: 0.00    Types: Cigarettes   Smokeless tobacco: Never  Vaping Use   Vaping Use: Never used  Substance Use Topics   Alcohol use: Yes    Comment: 5 drinks/weekends   Drug use: Not Currently    Prior to Admission medications   Medication Sig Start Date End Date Taking? Authorizing Provider  amoxicillin-clavulanate (AUGMENTIN) 875-125 MG tablet Take 1 tablet by mouth 2 (two) times daily. One po bid x 7 days 04/20/21  Yes Neka Bise, MD  oxyCODONE-acetaminophen (PERCOCET) 5-325 MG tablet Take 1 tablet by mouth every 6 (six) hours as needed for severe pain. 04/20/21  Yes Audray Rumore, MD  guaiFENesin-codeine (ROBITUSSIN AC) 100-10 MG/5ML syrup Take 5 mLs by mouth 3 (three) times daily as needed for cough. 12/10/17   Kellie Shropshire, PA-C    Allergies Patient has no known allergies.   REVIEW OF SYSTEMS   Negative except as noted here or in the History of Present Illness.   PHYSICAL EXAMINATION  Initial Vital Signs Blood pressure 127/78, pulse (!) 114, temperature 99.2 F (37.3 C), temperature source Oral, resp. rate 16, height 5\' 7"  (1.702 m), weight (!) 176.9 kg, SpO2 99 %.  Examination General: Well-developed, high BMI male in no acute distress; appearance consistent with age of record HENT: normocephalic; atraumatic Eyes: pupils equal, round and reactive to light; extraocular muscles intact Neck: supple Heart: regular rate and rhythm Lungs: clear to auscultation bilaterally Abdomen: soft; nondistended; diffusely tender; bowel sounds present Extremities: No deformity; full range of motion Neurologic: Sleeping but readily awakened; motor function intact in all extremities and symmetric; no facial droop Skin: Warm and dry Psychiatric: Normal mood and affect   RESULTS  Summary of this visit's results, reviewed and interpreted by myself:   EKG Interpretation  Date/Time:    Ventricular Rate:    PR Interval:    QRS Duration:   QT Interval:    QTC Calculation:   R Axis:     Text Interpretation:          Laboratory Studies: Results for orders placed or performed during the hospital encounter of 04/20/21 (from the past 24 hour(s))  CBC with Differential     Status: Abnormal  Collection Time: 04/20/21  2:28 AM  Result Value Ref Range   WBC 16.9 (H) 4.0 - 10.5 K/uL   RBC 5.29 4.22 - 5.81 MIL/uL   Hemoglobin 14.5 13.0 - 17.0 g/dL   HCT 41.9 62.2 - 29.7 %   MCV 84.1 80.0 - 100.0 fL   MCH 27.4 26.0 - 34.0 pg   MCHC 32.6 30.0 - 36.0 g/dL   RDW 98.9 21.1 - 94.1 %   Platelets 235 150 - 400 K/uL   nRBC 0.0 0.0 - 0.2 %   Neutrophils Relative % 54 %   Neutro Abs 9.1 (H) 1.7 - 7.7 K/uL   Lymphocytes Relative 35 %   Lymphs Abs 5.9 (H) 0.7 - 4.0 K/uL   Monocytes Relative 8 %   Monocytes Absolute 1.4 (H) 0.1 - 1.0 K/uL   Eosinophils Relative 2 %   Eosinophils Absolute 0.3  0.0 - 0.5 K/uL   Basophils Relative 1 %   Basophils Absolute 0.2 (H) 0.0 - 0.1 K/uL   WBC Morphology MORPHOLOGY UNREMARKABLE    RBC Morphology MORPHOLOGY UNREMARKABLE    Smear Review MORPHOLOGY UNREMARKABLE    Abs Immature Granulocytes 0.00 0.00 - 0.07 K/uL  Comprehensive metabolic panel     Status: Abnormal   Collection Time: 04/20/21  2:28 AM  Result Value Ref Range   Sodium 137 135 - 145 mmol/L   Potassium 3.9 3.5 - 5.1 mmol/L   Chloride 102 98 - 111 mmol/L   CO2 26 22 - 32 mmol/L   Glucose, Bld 113 (H) 70 - 99 mg/dL   BUN 10 6 - 20 mg/dL   Creatinine, Ser 7.40 0.61 - 1.24 mg/dL   Calcium 9.3 8.9 - 81.4 mg/dL   Total Protein 7.3 6.5 - 8.1 g/dL   Albumin 4.0 3.5 - 5.0 g/dL   AST 21 15 - 41 U/L   ALT 27 0 - 44 U/L   Alkaline Phosphatase 53 38 - 126 U/L   Total Bilirubin 0.2 (L) 0.3 - 1.2 mg/dL   GFR, Estimated >48 >18 mL/min   Anion gap 9 5 - 15  Lipase, blood     Status: None   Collection Time: 04/20/21  2:28 AM  Result Value Ref Range   Lipase 29 11 - 51 U/L  Urinalysis, Routine w reflex microscopic Urine, Clean Catch     Status: Abnormal   Collection Time: 04/20/21  2:33 AM  Result Value Ref Range   Color, Urine YELLOW YELLOW   APPearance CLEAR CLEAR   Specific Gravity, Urine 1.025 1.005 - 1.030   pH 6.0 5.0 - 8.0   Glucose, UA NEGATIVE NEGATIVE mg/dL   Hgb urine dipstick SMALL (A) NEGATIVE   Bilirubin Urine NEGATIVE NEGATIVE   Ketones, ur NEGATIVE NEGATIVE mg/dL   Protein, ur NEGATIVE NEGATIVE mg/dL   Nitrite NEGATIVE NEGATIVE   Leukocytes,Ua NEGATIVE NEGATIVE  Urinalysis, Microscopic (reflex)     Status: Abnormal   Collection Time: 04/20/21  2:33 AM  Result Value Ref Range   RBC / HPF 0-5 0 - 5 RBC/hpf   WBC, UA 0-5 0 - 5 WBC/hpf   Bacteria, UA RARE (A) NONE SEEN   Squamous Epithelial / LPF 0-5 0 - 5   Imaging Studies: CT ABDOMEN PELVIS W CONTRAST  Result Date: 04/20/2021 CLINICAL DATA:  Nonlocalized acute abdominal pain. Radiates to back. Headache and  fatigue. Joint pain and body aches. EXAM: CT ABDOMEN AND PELVIS WITH CONTRAST TECHNIQUE: Multidetector CT imaging of the abdomen and pelvis was  performed using the standard protocol following bolus administration of intravenous contrast. CONTRAST:  OMNIPAQUE IOHEXOL 300 MG/ML  SOLN COMPARISON:  CT abdomen pelvis 12/23/2019 FINDINGS: Lower chest: No acute abnormality. Hepatobiliary: No focal liver abnormality. No gallstones, gallbladder wall thickening, or pericholecystic fluid. No biliary dilatation. Pancreas: No focal lesion. Normal pancreatic contour. No surrounding inflammatory changes. No main pancreatic ductal dilatation. Spleen: Normal in size without focal abnormality. Adrenals/Urinary Tract: No adrenal nodule bilaterally. Bilateral kidneys enhance symmetrically. No hydronephrosis. No hydroureter. The urinary bladder is unremarkable. Stomach/Bowel: Stomach is within normal limits. No evidence of small bowel wall thickening or dilatation. Few scattered colonic diverticula. Similar-appearing bowel wall thickening and pericolonic fat stranding along a mid sigmoid colon. No intramural abscess formation. There is loss of fat plane between the inflamed sigmoid colon and adjacent small bowel loop (2:64). Appendix appears normal. Vascular/Lymphatic: No abdominal aorta or iliac aneurysm. Mild atherosclerotic plaque of the aorta and its branches. No abdominal, pelvic, or inguinal lymphadenopathy. Reproductive: Prostate is unremarkable. Other: No intraperitoneal free fluid. No intraperitoneal free gas. No organized fluid collection. Musculoskeletal: No abdominal wall hernia or abnormality. No suspicious lytic or blastic osseous lesions. No acute displaced fracture. IMPRESSION: 1. Few scattered sigmoid diverticula with associated acute diverticulitis. Given that inflammatory findings are in a similar location and have a similar appearance compared to CT abdomen pelvis 12/23/2019, recommend colonoscopy status post  treatment and status post resolution of inflammatory changes to rule out an underlying malignant lesion. 2. Loss of fat plane between the inflamed sigmoid colon and adjacent anterior small bowel loop - enterocolic fistulization not excluded. 3. No associated bowel perforation or intramural hematoma formation. 4.  Aortic Atherosclerosis (ICD10-I70.0). Electronically Signed   By: Tish Frederickson M.D.   On: 04/20/2021 05:42    ED COURSE and MDM  Nursing notes, initial and subsequent vitals signs, including pulse oximetry, reviewed and interpreted by myself.  Vitals:   04/20/21 0208 04/20/21 0211 04/20/21 0437 04/20/21 0441  BP:  127/78 121/70 121/70  Pulse:  (!) 114 (!) 104 100  Resp:  16 18 16   Temp:  99.2 F (37.3 C)    TempSrc:  Oral    SpO2:  99% 100% 97%  Weight: (!) 176.9 kg     Height: 5\' 7"  (1.702 m)      Medications  amoxicillin-clavulanate (AUGMENTIN) 875-125 MG per tablet 1 tablet (has no administration in time range)  fentaNYL (SUBLIMAZE) injection 100 mcg (100 mcg Intravenous Given 04/20/21 0439)  ondansetron (ZOFRAN) injection 4 mg (4 mg Intravenous Given 04/20/21 0437)  iohexol (OMNIPAQUE) 300 MG/ML solution 100 mL (100 mLs Intravenous Contrast Given 04/20/21 0459)   5:52 AM We will treat for acute diverticulitis and refer to gastroenterology for follow-up.  He was advised of the radiologist concerns including malignancy.  The patient's pain and tenderness are more diffuse than usually associated with diverticulitis so he was advised to return to the ED if symptoms worsen or change.   PROCEDURES  Procedures   ED DIAGNOSES     ICD-10-CM   1. Diverticulitis  K57.92          Elayna Tobler, 06/21/21, MD 04/20/21 [phone]

## 2021-04-20 NOTE — ED Notes (Signed)
Patient transported to CT 

## 2022-03-27 IMAGING — CT CT ABD-PELV W/ CM
2 of 4 series · 16 of 46 positions shown, 18 images · IV contrast (Omnipaque)
Comparison: CT abdomen pelvis 12/23/2019

CLINICAL DATA: Nonlocalized acute abdominal pain. Radiates to back.
Headache and fatigue. Joint pain and body aches.

EXAM:
CT ABDOMEN AND PELVIS WITH CONTRAST
TECHNIQUE: Multidetector CT imaging of the abdomen and pelvis was performed
using the standard protocol following bolus administration of
intravenous contrast.
CONTRAST:  100mL OMNIPAQUE IOHEXOL 300 MG/ML  SOLN

[Series 2: axial st · axial · 0.98mm/px · z∈[+599,+1089]mm · 13 of 108 slices shown, 15 images]
[im 5/108  soft-tissue]
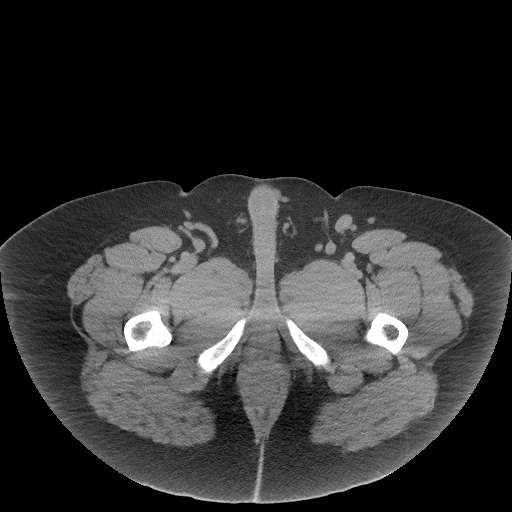
[im 5/108  bone]
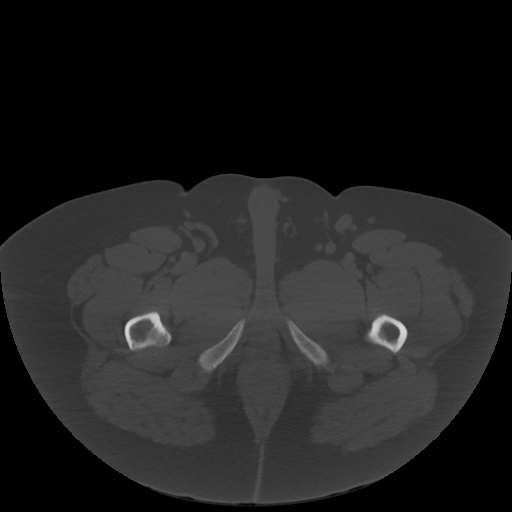
[im 15/108  soft-tissue]
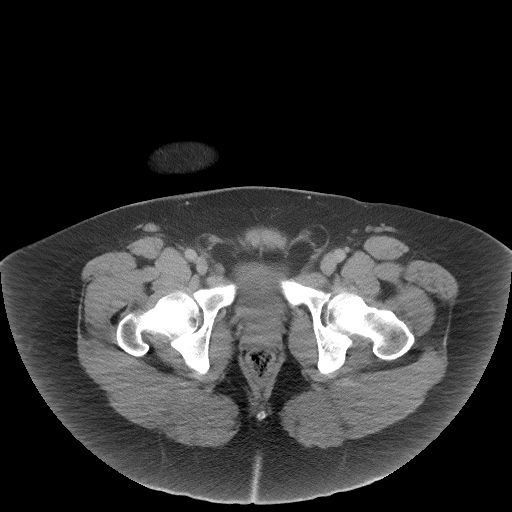
[im 25/108  soft-tissue]
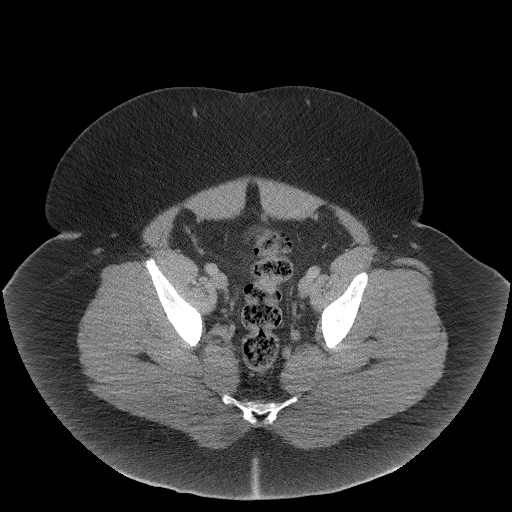
[im 30/108  soft-tissue]
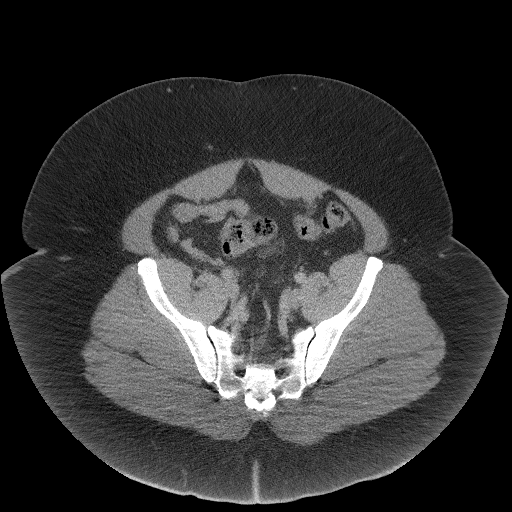
[im 39/108  soft-tissue]
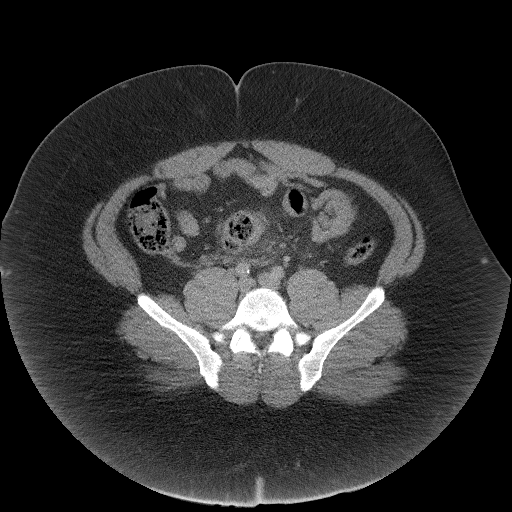
[im 44/108  soft-tissue]
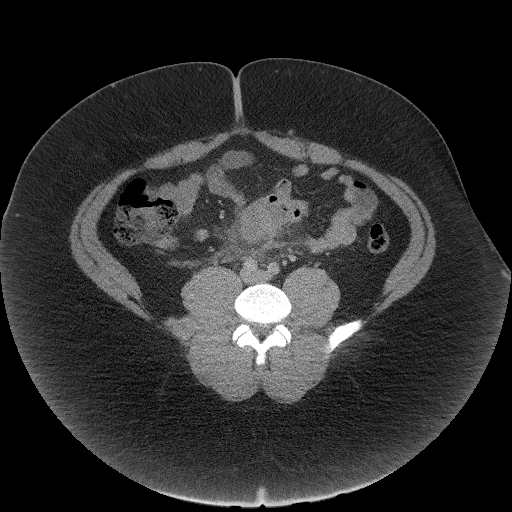
[im 54/108  soft-tissue]
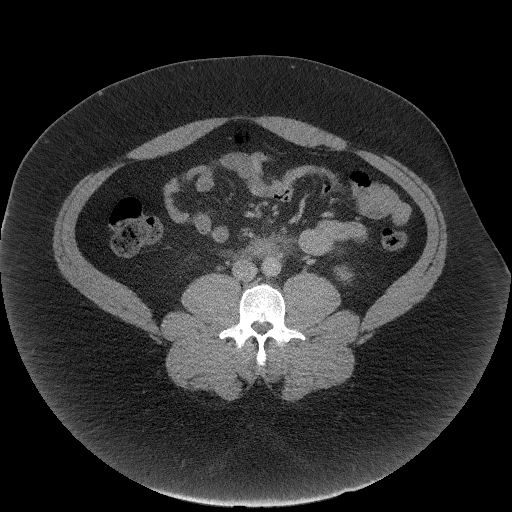
[im 64/108  soft-tissue]
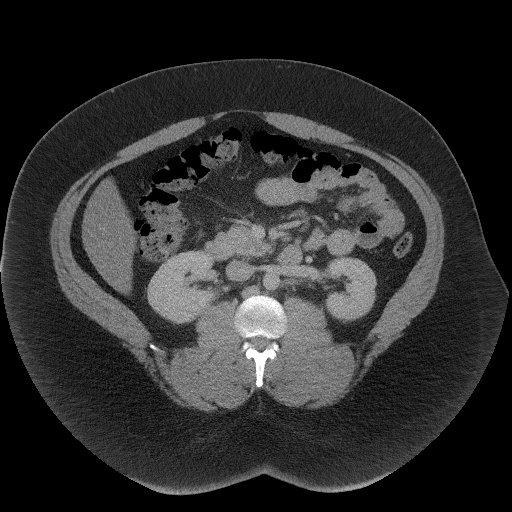
[im 69/108  soft-tissue]
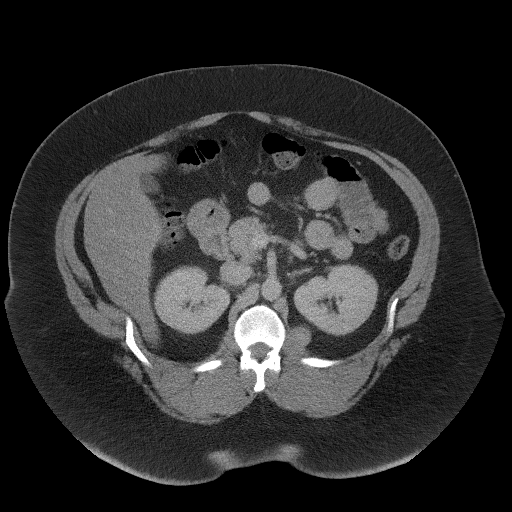
[im 69/108  bone]
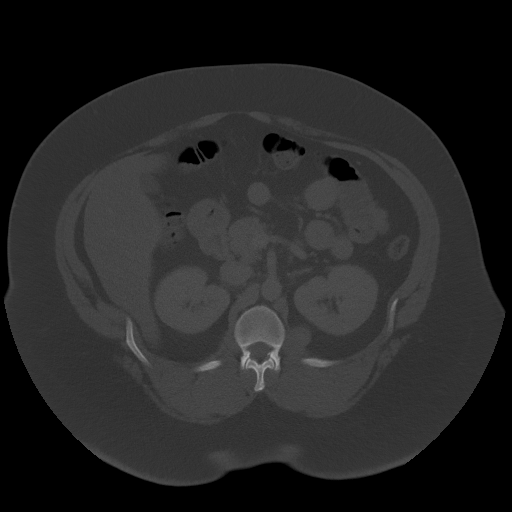
[im 78/108  soft-tissue]
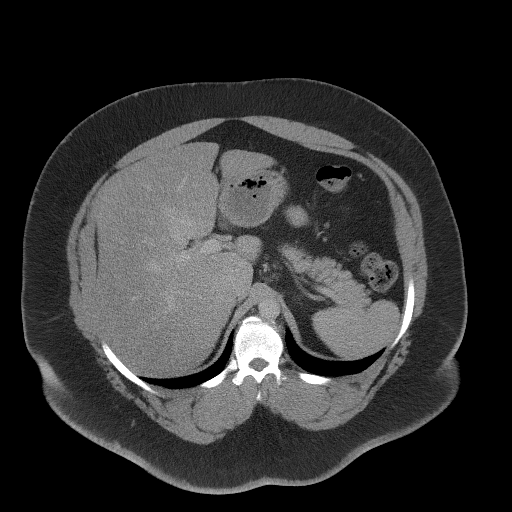
[im 83/108  soft-tissue]
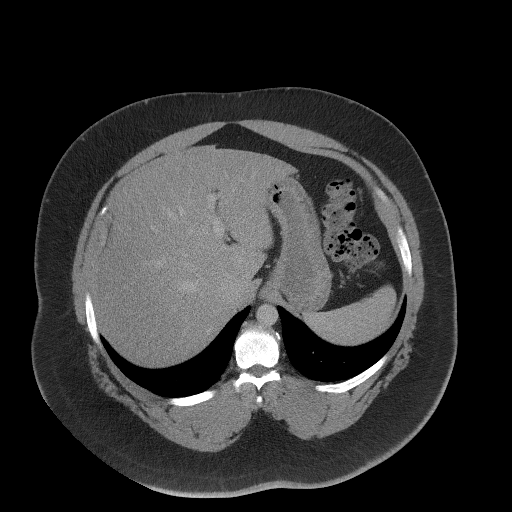
[im 93/108  soft-tissue]
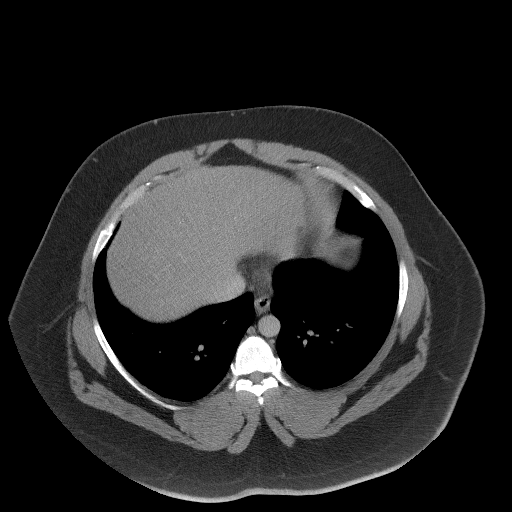
[im 103/108  soft-tissue]
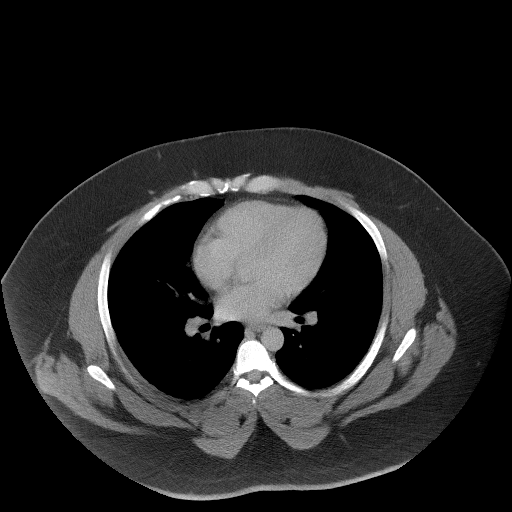

[Series 5: coronal st · coronal · 0.97mm/px · 3 of 143 slices shown]
[im 48/143  soft-tissue]
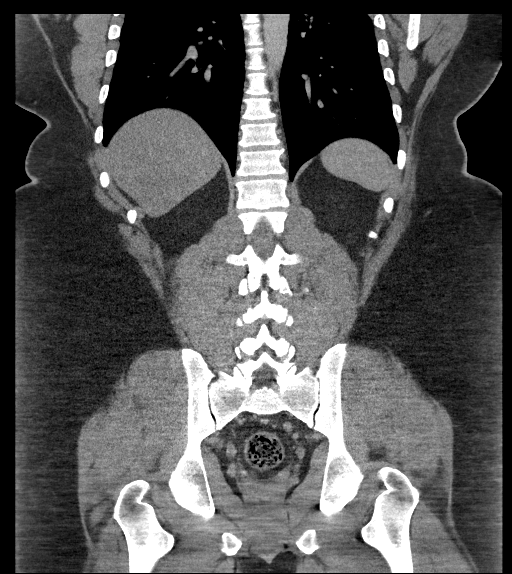
[im 64/143  soft-tissue]
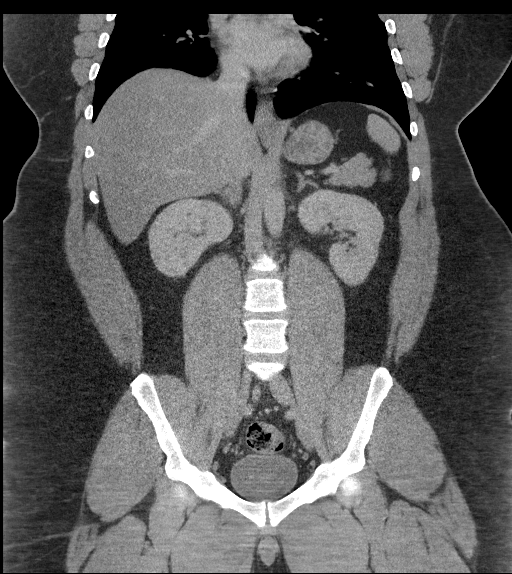
[im 79/143  soft-tissue]
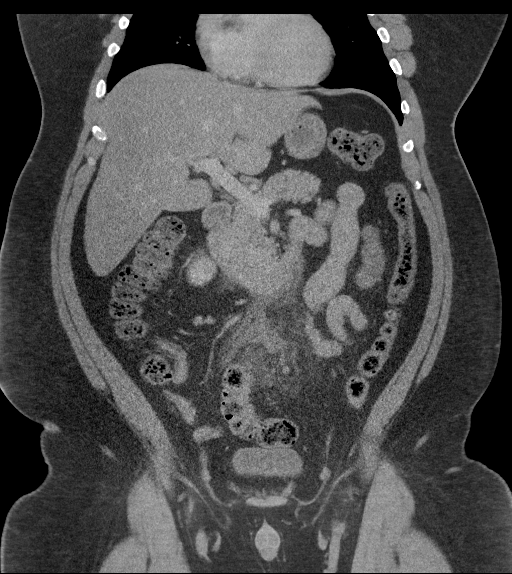

[16 of 46 positions shown; findings below may reference images not displayed]

FINDINGS: Lower chest: No acute abnormality.

Hepatobiliary: No focal liver abnormality. No gallstones,
gallbladder wall thickening, or pericholecystic fluid. No biliary
dilatation.

Pancreas: No focal lesion. Normal pancreatic contour. No surrounding
inflammatory changes. No main pancreatic ductal dilatation.

Spleen: Normal in size without focal abnormality.

Adrenals/Urinary Tract:

No adrenal nodule bilaterally.

Bilateral kidneys enhance symmetrically. No hydronephrosis. No
hydroureter.

The urinary bladder is unremarkable.

Stomach/Bowel: Stomach is within normal limits. No evidence of small
bowel wall thickening or dilatation. Few scattered colonic
diverticula. Similar-appearing bowel wall thickening and pericolonic
fat stranding along a mid sigmoid colon. No intramural abscess
formation. There is loss of fat plane between the inflamed sigmoid
colon and adjacent small bowel loop ([DATE]). Appendix appears normal.

Vascular/Lymphatic: No abdominal aorta or iliac aneurysm. Mild
atherosclerotic plaque of the aorta and its branches. No abdominal,
pelvic, or inguinal lymphadenopathy.

Reproductive: Prostate is unremarkable.

Other: No intraperitoneal free fluid. No intraperitoneal free gas.
No organized fluid collection.

Musculoskeletal:

No abdominal wall hernia or abnormality.

No suspicious lytic or blastic osseous lesions. No acute displaced
fracture.
IMPRESSION: 1. Few scattered sigmoid diverticula with associated acute
diverticulitis. Given that inflammatory findings are in a similar
location and have a similar appearance compared to CT abdomen pelvis
12/23/2019, recommend colonoscopy status post treatment and status
post resolution of inflammatory changes to rule out an underlying
malignant lesion.
2. Loss of fat plane between the inflamed sigmoid colon and adjacent
anterior small bowel loop - enterocolic fistulization not excluded.
3. No associated bowel perforation or intramural hematoma formation.
4.  Aortic Atherosclerosis ([id number]).

## 2022-06-14 ENCOUNTER — Emergency Department (HOSPITAL_BASED_OUTPATIENT_CLINIC_OR_DEPARTMENT_OTHER): Payer: Commercial Managed Care - PPO

## 2022-06-14 ENCOUNTER — Encounter (HOSPITAL_BASED_OUTPATIENT_CLINIC_OR_DEPARTMENT_OTHER): Payer: Self-pay | Admitting: Emergency Medicine

## 2022-06-14 ENCOUNTER — Emergency Department (HOSPITAL_BASED_OUTPATIENT_CLINIC_OR_DEPARTMENT_OTHER)
Admission: EM | Admit: 2022-06-14 | Discharge: 2022-06-14 | Disposition: A | Discharge status: Home / Self Care | Payer: Commercial Managed Care - PPO | Attending: Emergency Medicine | Admitting: Emergency Medicine

## 2022-06-14 DIAGNOSIS — M25561 Pain in right knee: Secondary | ICD-10-CM

## 2022-06-14 DIAGNOSIS — Z720 Tobacco use: Secondary | ICD-10-CM | POA: Diagnosis not present

## 2022-06-14 DIAGNOSIS — Y92410 Unspecified street and highway as the place of occurrence of the external cause: Secondary | ICD-10-CM | POA: Insufficient documentation

## 2022-06-14 DIAGNOSIS — M1711 Unilateral primary osteoarthritis, right knee: Secondary | ICD-10-CM | POA: Diagnosis not present

## 2022-06-14 DIAGNOSIS — M545 Low back pain, unspecified: Secondary | ICD-10-CM | POA: Insufficient documentation

## 2022-06-14 DIAGNOSIS — R000 Tachycardia, unspecified: Secondary | ICD-10-CM | POA: Insufficient documentation

## 2022-06-14 MED ORDER — IBUPROFEN 400 MG PO TABS
600.0000 mg | ORAL_TABLET | Freq: Once | ORAL | Status: AC
  Administered 2022-06-14: 600 mg via ORAL
  Filled 2022-06-14: qty 1

## 2022-06-14 MED ORDER — LIDOCAINE 5 % EX PTCH
1.0000 | MEDICATED_PATCH | CUTANEOUS | Status: DC
  Administered 2022-06-14: 1 via TRANSDERMAL
  Filled 2022-06-14: qty 1
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
## 2022-06-14 NOTE — Discharge Instructions (Addendum)
Note that your work-up today was overall reassuring.  Your x-ray imaging of your right knee showed no acute fracture.  I recommend rest, ice, elevation of affected knee and take ibuprofen every 6 hours as needed for pain/inflammation.  Consider finding an over-the-counter supportive knee brace to help with some pain relief.  I have attached an orthopedics number to follow-up with outpatient as soon as you are able to make an appointment.  They are the bone doctors and will be able to help with chronic issues if your knee does not get better.  You can use the knee brace as needed for added support.  Please do not hesitate to return to the emergency department if the worrisome signs and symptoms we discussed become apparent.
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
## 2022-06-14 NOTE — ED Notes (Signed)
Patient transported to X-ray 

## 2022-11-01 ENCOUNTER — Inpatient Hospital Stay (HOSPITAL_BASED_OUTPATIENT_CLINIC_OR_DEPARTMENT_OTHER)
Admission: EM | Admit: 2022-11-01 | Discharge: 2022-11-04 | DRG: 392 | Disposition: A | Discharge status: Home / Self Care | Payer: Commercial Managed Care - PPO | Attending: Internal Medicine | Admitting: Internal Medicine

## 2022-11-01 ENCOUNTER — Other Ambulatory Visit: Payer: Self-pay

## 2022-11-01 ENCOUNTER — Emergency Department (HOSPITAL_BASED_OUTPATIENT_CLINIC_OR_DEPARTMENT_OTHER): Payer: Commercial Managed Care - PPO

## 2022-11-01 ENCOUNTER — Encounter (HOSPITAL_BASED_OUTPATIENT_CLINIC_OR_DEPARTMENT_OTHER): Payer: Self-pay | Admitting: Emergency Medicine

## 2022-11-01 DIAGNOSIS — K5732 Diverticulitis of large intestine without perforation or abscess without bleeding: Secondary | ICD-10-CM | POA: Diagnosis not present

## 2022-11-01 DIAGNOSIS — F17200 Nicotine dependence, unspecified, uncomplicated: Secondary | ICD-10-CM | POA: Diagnosis present

## 2022-11-01 DIAGNOSIS — Z6841 Body Mass Index (BMI) 40.0 and over, adult: Secondary | ICD-10-CM

## 2022-11-01 DIAGNOSIS — Z1152 Encounter for screening for COVID-19: Secondary | ICD-10-CM

## 2022-11-01 DIAGNOSIS — K529 Noninfective gastroenteritis and colitis, unspecified: Secondary | ICD-10-CM | POA: Diagnosis present

## 2022-11-01 DIAGNOSIS — K5792 Diverticulitis of intestine, part unspecified, without perforation or abscess without bleeding: Secondary | ICD-10-CM | POA: Diagnosis present

## 2022-11-01 DIAGNOSIS — Z833 Family history of diabetes mellitus: Secondary | ICD-10-CM

## 2022-11-01 DIAGNOSIS — F1010 Alcohol abuse, uncomplicated: Secondary | ICD-10-CM | POA: Diagnosis present

## 2022-11-01 DIAGNOSIS — F17210 Nicotine dependence, cigarettes, uncomplicated: Secondary | ICD-10-CM | POA: Diagnosis present

## 2022-11-01 HISTORY — DX: Diverticulitis of intestine, part unspecified, without perforation or abscess without bleeding: K57.92

## 2022-11-01 LAB — URINALYSIS, ROUTINE W REFLEX MICROSCOPIC
Bilirubin Urine: NEGATIVE
Glucose, UA: NEGATIVE mg/dL
Ketones, ur: NEGATIVE mg/dL
Leukocytes,Ua: NEGATIVE
Nitrite: NEGATIVE
Protein, ur: NEGATIVE mg/dL
Specific Gravity, Urine: 1.02 (ref 1.005–1.030)
pH: 6 (ref 5.0–8.0)

## 2022-11-01 LAB — URINALYSIS, MICROSCOPIC (REFLEX)

## 2022-11-01 LAB — CBC
HCT: 46.7 % (ref 39.0–52.0)
Hemoglobin: 15.4 g/dL (ref 13.0–17.0)
MCH: 27.3 pg (ref 26.0–34.0)
MCHC: 33 g/dL (ref 30.0–36.0)
MCV: 82.8 fL (ref 80.0–100.0)
Platelets: 279 10*3/uL (ref 150–400)
RBC: 5.64 MIL/uL (ref 4.22–5.81)
RDW: 15.7 % — ABNORMAL HIGH (ref 11.5–15.5)
WBC: 18.5 10*3/uL — ABNORMAL HIGH (ref 4.0–10.5)
nRBC: 0 % (ref 0.0–0.2)

## 2022-11-01 LAB — COMPREHENSIVE METABOLIC PANEL
ALT: 22 U/L (ref 0–44)
AST: 18 U/L (ref 15–41)
Albumin: 4.1 g/dL (ref 3.5–5.0)
Alkaline Phosphatase: 55 U/L (ref 38–126)
Anion gap: 10 (ref 5–15)
BUN: 13 mg/dL (ref 6–20)
CO2: 26 mmol/L (ref 22–32)
Calcium: 9 mg/dL (ref 8.9–10.3)
Chloride: 100 mmol/L (ref 98–111)
Creatinine, Ser: 1.2 mg/dL (ref 0.61–1.24)
GFR, Estimated: >60 mL/min (ref >60)
Glucose, Bld: 106 mg/dL — ABNORMAL HIGH (ref 70–99)
Potassium: 3.9 mmol/L (ref 3.5–5.1)
Sodium: 136 mmol/L (ref 135–145)
Total Bilirubin: 0.6 mg/dL (ref 0.3–1.2)
Total Protein: 8.1 g/dL (ref 6.5–8.1)

## 2022-11-01 LAB — RESP PANEL BY RT-PCR (RSV, FLU A&B, COVID)  RVPGX2
Influenza A by PCR: NEGATIVE
Influenza B by PCR: NEGATIVE
Resp Syncytial Virus by PCR: NEGATIVE
SARS Coronavirus 2 by RT PCR: NEGATIVE

## 2022-11-01 LAB — LACTIC ACID, PLASMA: Lactic Acid, Venous: 0.8 mmol/L (ref 0.5–1.9)

## 2022-11-01 LAB — LIPASE, BLOOD: Lipase: 33 U/L (ref 11–51)

## 2022-11-01 MED ORDER — PIPERACILLIN-TAZOBACTAM 3.375 G IVPB 30 MIN
3.3750 g | Freq: Once | INTRAVENOUS | Status: AC
  Administered 2022-11-01: 3.375 g via INTRAVENOUS
  Filled 2022-11-01: qty 50

## 2022-11-01 MED ORDER — MORPHINE SULFATE (PF) 4 MG/ML IV SOLN
4.0000 mg | Freq: Once | INTRAVENOUS | Status: AC
  Administered 2022-11-01: 4 mg via INTRAVENOUS
  Filled 2022-11-01: qty 1

## 2022-11-01 MED ORDER — IOHEXOL 300 MG/ML  SOLN
100.0000 mL | Freq: Once | INTRAMUSCULAR | Status: AC | PRN
  Administered 2022-11-01: 100 mL via INTRAVENOUS

## 2022-11-01 MED ORDER — LACTATED RINGERS IV BOLUS
1000.0000 mL | Freq: Once | INTRAVENOUS | Status: AC
  Administered 2022-11-01: 1000 mL via INTRAVENOUS

## 2022-11-01 MED ORDER — ACETAMINOPHEN 325 MG PO TABS
650.0000 mg | ORAL_TABLET | Freq: Once | ORAL | Status: AC
  Administered 2022-11-01: 650 mg via ORAL
  Filled 2022-11-01: qty 2

## 2022-11-01 MED ORDER — NICOTINE 21 MG/24HR TD PT24
21.0000 mg | MEDICATED_PATCH | Freq: Once | TRANSDERMAL | Status: AC
  Administered 2022-11-01: 21 mg via TRANSDERMAL
  Filled 2022-11-01: qty 1

## 2022-11-01 NOTE — ED Triage Notes (Addendum)
Pt presents to ED POV. Pt c/o generalized abd pain that began yesterday. Pain is constant and a 10/10. Pt reports a decrease in size of bowel movements. Hx of diverticulitis

## 2022-11-01 NOTE — Progress Notes (Signed)
Plan of Care Note for accepted transfer   Patient: Javier Bowen MRN: [id number]   [place]: 11/01/2022  Facility requesting transfer: [facility]  Requesting Provider: Dr. Mayra Neer   Reason for transfer: Acute diverticulitis   Facility course: 37 yr old man with hx of diverticulitis and BMI 61 who presents with abdominal pain and is found to be febrile and tachycardic with leukocytosis and CT findings suggestive of acute uncomplicated sigmoid diverticulitis or colitis. Blood cultures were ordered and patient is to receive Zosyn. Lactic acid is pending.   Plan of care: The patient is accepted for admission to Telemetry unit, at [facility].   Author: Vianne Bulls, MD 11/01/2022  Check [email or web address] for on-call coverage.  Nursing staff, Please call [place] number on Amion as soon as patient's arrival, so appropriate admitting provider can evaluate the pt.

## 2022-11-01 NOTE — ED Provider Notes (Signed)
[facility] EMERGENCY DEPARTMENT Provider Note   CSN: [id number] Arrival date & time: 11/01/22  1905     History  Chief Complaint  Patient presents with   Abdominal Pain    Javier Bowen is a 37 y.o. male with h/o diverticulitis presents with abd pain.   Pt c/o generalized abd pain that began yesterday. Pain is constant and a 10/10. Pt reports a decrease in size of bowel movements, notes like he is having diarrhea and constipation at the same time. Hurts to bear down in his stomach so hasn't wanted to go. Also endorses periodic BRBPR that has been going on intermittently for years, has never seen anyone for it or had a colonoscopy. Hx of diverticulitis four times, never required surgery or hospitalization in the past. Denies N/V, fevers/chills, CP/SOB.    Abdominal Pain      Home Medications Prior to Admission medications   Medication Sig Start Date End Date Taking? Authorizing Provider  amoxicillin-clavulanate (AUGMENTIN) 875-125 MG tablet Take 1 tablet by mouth 2 (two) times daily. One po bid x 7 days 04/20/21   Molpus, John, MD  guaiFENesin-codeine [facility]) 100-10 MG/5ML syrup Take 5 mLs by mouth 3 (three) times daily as needed for cough. 12/10/17   Kellie Shropshire, PA-C  oxyCODONE-acetaminophen (PERCOCET) 5-325 MG tablet Take 1 tablet by mouth every 6 (six) hours as needed for severe pain. 04/20/21   Molpus, Jonny Ruiz, MD      Allergies    Patient has no known allergies.    Review of Systems   Review of Systems  Gastrointestinal:  Positive for abdominal pain.   Review of systems Negative for CP.  A 10 point review of systems was performed and is negative unless otherwise reported in HPI.  Physical Exam Updated Vital Signs BP (!) 154/81   Pulse (!) 105   Temp 99.9 F (37.7 C) (Oral)   Resp 12   SpO2 99%  Physical Exam General: Normal appearing obese male, lying in bed.  HEENT: PERRLA, Sclera anicteric, MMM, trachea midline.  Cardiology: RRR, no  murmurs/rubs/gallops. BL radial and DP pulses equal bilaterally.  Resp: Normal respiratory rate and effort. CTAB, no wheezes, rhonchi, crackles.  Abd: Diffuse TTP. Soft, non-tender, non-distended. No rebound tenderness or guarding.  GU: Deferred. MSK: No peripheral edema or signs of trauma. Extremities without deformity or TTP. No cyanosis or clubbing. Skin: warm, dry. No rashes or lesions. Neuro: A&Ox4, CNs II-XII grossly intact. MAEs. Sensation grossly intact.  Psych: Normal mood and affect.   ED Results / Procedures / Treatments   Labs (all labs ordered are listed, but only abnormal results are displayed) Labs Reviewed  COMPREHENSIVE METABOLIC PANEL - Abnormal; Notable for the following components:      Result Value   Glucose, Bld 106 (*)    All other components within normal limits  CBC - Abnormal; Notable for the following components:   WBC 18.5 (*)    RDW 15.7 (*)    All other components within normal limits  URINALYSIS, ROUTINE W REFLEX MICROSCOPIC - Abnormal; Notable for the following components:   Hgb urine dipstick MODERATE (*)    All other components within normal limits  URINALYSIS, MICROSCOPIC (REFLEX) - Abnormal; Notable for the following components:   Bacteria, UA FEW (*)    All other components within normal limits  RESP PANEL BY RT-PCR (RSV, FLU A&B, COVID)  [id number]  CULTURE, BLOOD (ROUTINE X 2)  CULTURE, BLOOD (ROUTINE X 2)  LIPASE, BLOOD  LACTIC ACID, PLASMA  LACTIC ACID, PLASMA    EKG EKG Interpretation  Date/Time:  Saturday November 01 2022 19:23:25 EST Ventricular Rate:  133 PR Interval:  124 QRS Duration: 101 QT Interval:  300 QTC Calculation: 447 R Axis:   0 Text Interpretation: Sinus tachycardia Confirmed by Vivi Barrack [phone]) on 11/01/2022 9:50:54 PM  Radiology CT ABDOMEN PELVIS W CONTRAST  Result Date: 11/01/2022 CLINICAL DATA:  Generalized abdominal pain for 1 day EXAM: CT ABDOMEN AND PELVIS WITH CONTRAST TECHNIQUE: Multidetector CT  imaging of the abdomen and pelvis was performed using the standard protocol following bolus administration of intravenous contrast. RADIATION DOSE REDUCTION: This exam was performed according to the departmental dose-optimization program which includes automated exposure control, adjustment of the mA and/or kV according to patient size and/or use of iterative reconstruction technique. CONTRAST:  OMNIPAQUE IOHEXOL 300 MG/ML  SOLN COMPARISON:  04/20/2021 FINDINGS: Lower chest: No acute pleural or parenchymal lung disease. Hepatobiliary: No focal liver abnormality is seen. No gallstones, gallbladder wall thickening, or biliary dilatation. Pancreas: Unremarkable. No pancreatic ductal dilatation or surrounding inflammatory changes. Spleen: Normal in size without focal abnormality. Adrenals/Urinary Tract: Adrenal glands are unremarkable. Kidneys are normal, without renal calculi, focal lesion, or hydronephrosis. The bladder is decompressed, limiting its evaluation. Stomach/Bowel: No bowel obstruction or ileus. There is segmental wall thickening of the mid sigmoid colon, with surrounding fat stranding. Findings are consistent with segmental colitis or diverticulitis. No evidence of perforation, fluid collection, or abscess. Normal appendix right lower quadrant. Vascular/Lymphatic: Stable aortic atherosclerosis. Multiple subcentimeter lymph nodes within the lower mesentery are likely reactive. No pathologic adenopathy. Reproductive: Prostate is unremarkable. Other: Trace free fluid within the lower abdominal mesentery. No free intraperitoneal gas. No abdominal wall hernia. Musculoskeletal: No acute or destructive bony lesions. Reconstructed images demonstrate no additional findings. IMPRESSION: 1. Segmental wall thickening and pericolonic fat stranding within the mid sigmoid colon, consistent with acute uncomplicated diverticulitis or colitis. No perforation, fluid collection, or abscess. Follow-up is recommended to  document complete resolution. 2. Subcentimeter reactive lymph nodes within the lower abdominal mesentery. 3.  Aortic Atherosclerosis (ICD10-I70.0). Electronically Signed   By: Sharlet Salina M.D.   On: 11/01/2022 20:54    Procedures .Critical Care  Performed by: Loetta Rough, MD Authorized by: Loetta Rough, MD   Critical care provider statement:    Critical care time (minutes):  45   Critical care was necessary to treat or prevent imminent or life-threatening deterioration of the following conditions:  Sepsis   Critical care was time spent personally by me on the following activities:  Development of treatment plan with patient or surrogate, discussions with consultants, evaluation of patient's response to treatment, examination of patient, ordering and review of laboratory studies, ordering and review of radiographic studies, ordering and performing treatments and interventions, pulse oximetry, re-evaluation of patient's condition and review of old charts   Care discussed with: admitting provider       Medications Ordered in ED Medications  piperacillin-tazobactam (ZOSYN) IVPB 3.375 g (3.375 g Intravenous New Bag/Given 11/01/22 2143)  lactated ringers bolus 1,000 mL (has no administration in time range)  nicotine (NICODERM CQ - dosed in mg/24 hours) patch 21 mg (has no administration in time range)  acetaminophen (TYLENOL) tablet 650 mg (650 mg Oral Given 11/01/22 1925)  iohexol (OMNIPAQUE) 300 MG/ML solution 100 mL (100 mLs Intravenous Contrast Given 11/01/22 2025)  lactated ringers bolus 1,000 mL (1,000 mLs Intravenous New Bag/Given 11/01/22 2045)  morphine (PF) 4 MG/ML injection  4 mg (4 mg Intravenous Given 11/01/22 2042)    ED Course/ Medical Decision Making/ A&P                          Medical Decision Making Amount and/or Complexity of Data Reviewed Labs: ordered. Decision-making details documented in ED Course. Radiology:  Decision-making details documented in ED  Course.  Risk OTC drugs. Prescription drug management. Decision regarding hospitalization.    This patient presents to the ED for concern of abd pain, this involves an extensive number of treatment options, and is a complaint that carries with it a high risk of complications and morbidity.  I considered the following differential and admission for this acute, potentially life threatening condition.   MDM:    For DDX for abdominal pain includes but is not limited to:  Abdominal exam without peritoneal signs. No evidence of acute abdomen at this time. W/ h/o diverticulitis, consider diverticulitis now and will obtain CT scan. Pt is febrile and tachycardic into the 130s in triage, concern for possible sepsis and will obtain blood cultures.  Patient has never had abdominal surgery or perforated as result of his diverticulitis.  Consider other causes abdominal pain including pancreatitis, cholecystitis/cholangitis, appendicitis, bowel obstruction.  No urinary symptoms to suggest UTI or pyelonephritis.  Clinical Course as of 11/01/22 2151  Sat Nov 01, 2022  2100 Resp panel by RT-PCR (RSV, Flu A&B, Covid) Nasopharyngeal Swab Neg [HN]  2100 UA w/ RBC 6-10 with +hgb, no UTI [HN]  2100 Lipase: 33 Neg [HN]  2100 WBC(!): 18.5 Leukocytosis [HN]  2101 CT ABDOMEN PELVIS W CONTRAST 1. Segmental wall thickening and pericolonic fat stranding within the mid sigmoid colon, consistent with acute  uncomplicated diverticulitis or colitis. No perforation, fluid collection, or abscess. Follow-up is recommended to document complete resolution. 2. Subcentimeter reactive lymph nodes within the lower abdominal mesentery. 3.  Aortic Atherosclerosis (ICD10-I70.0).   [HN]  2121 D/t c/f sepsis, will treat w/ 2L IVF bolus and obtain blood cultures/lactate. Ordered IV zosyn. Pt is overall non-toxic appearing and normotensive though still tachycardic prior to 2nd L IVF. Consulted to medicine for admission.  [HN]     Clinical Course User Index [HN] Audley Hose, MD    Labs: I Ordered, and personally interpreted labs.  The pertinent results include:  those listed above  Imaging Studies ordered: Triage ordered imaging studies including CT A/P I independently visualized and interpreted imaging. I agree with the radiologist interpretation  Additional history obtained from chart review.    Cardiac Monitoring: The patient was maintained on a cardiac monitor.  I personally viewed and interpreted the cardiac monitored which showed an underlying rhythm of: ST  Reevaluation: After the interventions noted above, I reevaluated the patient and found that they have :improved  Social Determinants of Health: Patient lives independently   Disposition:  Admit  Co morbidities that complicate the patient evaluation  Past Medical History:  Diagnosis Date   Morbid obesity ([place])      Medicines Meds ordered this encounter  Medications   acetaminophen (TYLENOL) tablet 650 mg   iohexol (OMNIPAQUE) 300 MG/ML solution 100 mL   lactated ringers bolus 1,000 mL   morphine (PF) 4 MG/ML injection 4 mg   piperacillin-tazobactam (ZOSYN) IVPB 3.375 g    Order Specific Question:   Antibiotic Indication:    Answer:   Intra-abdominal Infection   lactated ringers bolus 1,000 mL   nicotine (NICODERM CQ - dosed in mg/24  hours) patch 21 mg    I have reviewed the patients home medicines and have made adjustments as needed  Problem List / ED Course: Problem List Items Addressed This Visit   None Visit Diagnoses     Sigmoid diverticulitis    -  Primary   Relevant Medications   morphine (PF) 4 MG/ML injection 4 mg (Completed)   piperacillin-tazobactam (ZOSYN) IVPB 3.375 g                   This note was created using dictation software, which may contain spelling or grammatical errors.    Audley Hose, MD 11/01/22 2151

## 2022-11-02 ENCOUNTER — Encounter (HOSPITAL_COMMUNITY): Payer: Self-pay | Admitting: Internal Medicine

## 2022-11-02 DIAGNOSIS — F1010 Alcohol abuse, uncomplicated: Secondary | ICD-10-CM | POA: Diagnosis present

## 2022-11-02 DIAGNOSIS — K529 Noninfective gastroenteritis and colitis, unspecified: Secondary | ICD-10-CM | POA: Diagnosis present

## 2022-11-02 DIAGNOSIS — Z1152 Encounter for screening for COVID-19: Secondary | ICD-10-CM | POA: Diagnosis not present

## 2022-11-02 DIAGNOSIS — Z6841 Body Mass Index (BMI) 40.0 and over, adult: Secondary | ICD-10-CM | POA: Diagnosis not present

## 2022-11-02 DIAGNOSIS — F17200 Nicotine dependence, unspecified, uncomplicated: Secondary | ICD-10-CM | POA: Diagnosis not present

## 2022-11-02 DIAGNOSIS — Z833 Family history of diabetes mellitus: Secondary | ICD-10-CM | POA: Diagnosis not present

## 2022-11-02 DIAGNOSIS — F17210 Nicotine dependence, cigarettes, uncomplicated: Secondary | ICD-10-CM | POA: Diagnosis present

## 2022-11-02 DIAGNOSIS — K5732 Diverticulitis of large intestine without perforation or abscess without bleeding: Secondary | ICD-10-CM | POA: Diagnosis present

## 2022-11-02 DIAGNOSIS — K5792 Diverticulitis of intestine, part unspecified, without perforation or abscess without bleeding: Secondary | ICD-10-CM | POA: Diagnosis not present

## 2022-11-02 MED ORDER — ACETAMINOPHEN 325 MG PO TABS: 650.0000 mg | ORAL_TABLET | Freq: Four times a day (QID) | ORAL | Status: DC | PRN

## 2022-11-02 MED ORDER — METRONIDAZOLE 500 MG/100ML IV SOLN
500.0000 mg | Freq: Two times a day (BID) | INTRAVENOUS | Status: DC
  Administered 2022-11-02 – 2022-11-04 (×4): 500 mg via INTRAVENOUS
  Filled 2022-11-02 (×4): qty 100

## 2022-11-02 MED ORDER — ACETAMINOPHEN 650 MG RE SUPP: 650.0000 mg | Freq: Four times a day (QID) | RECTAL | Status: DC | PRN

## 2022-11-02 MED ORDER — ONDANSETRON 4 MG PO TBDP: 4.0000 mg | ORAL_TABLET | Freq: Four times a day (QID) | ORAL | Status: DC | PRN

## 2022-11-02 MED ORDER — LACTATED RINGERS IV SOLN: INTRAVENOUS | Status: DC

## 2022-11-02 MED ORDER — ONDANSETRON HCL 4 MG/2ML IJ SOLN: 4.0000 mg | Freq: Four times a day (QID) | INTRAMUSCULAR | Status: DC | PRN

## 2022-11-02 MED ORDER — OXYCODONE HCL 5 MG PO TABS
5.0000 mg | ORAL_TABLET | ORAL | Status: DC | PRN
  Administered 2022-11-02 – 2022-11-04 (×5): 10 mg via ORAL
  Filled 2022-11-02 (×6): qty 2

## 2022-11-02 MED ORDER — NICOTINE 21 MG/24HR TD PT24
21.0000 mg | MEDICATED_PATCH | Freq: Every day | TRANSDERMAL | Status: DC
  Administered 2022-11-02 – 2022-11-03 (×2): 21 mg via TRANSDERMAL
  Filled 2022-11-02 (×3): qty 1

## 2022-11-02 MED ORDER — MORPHINE SULFATE (PF) 4 MG/ML IV SOLN
4.0000 mg | Freq: Once | INTRAVENOUS | Status: AC
  Administered 2022-11-02: 4 mg via INTRAVENOUS
  Filled 2022-11-02: qty 1

## 2022-11-02 MED ORDER — HYDRALAZINE HCL 20 MG/ML IJ SOLN: 10.0000 mg | INTRAMUSCULAR | Status: DC | PRN

## 2022-11-02 MED ORDER — MORPHINE SULFATE (PF) 2 MG/ML IV SOLN
2.0000 mg | INTRAVENOUS | Status: DC | PRN
  Administered 2022-11-02 – 2022-11-03 (×2): 2 mg via INTRAVENOUS
  Filled 2022-11-02 (×2): qty 1

## 2022-11-02 MED ORDER — SODIUM CHLORIDE 0.9 % IV SOLN
2.0000 g | INTRAVENOUS | Status: DC
  Administered 2022-11-02 – 2022-11-03 (×2): 2 g via INTRAVENOUS
  Filled 2022-11-02 (×2): qty 20

## 2022-11-02 NOTE — H&P (Signed)
History and Physical    Patient: Javier Bowen YSA:[id number] DOB: September 13, 1986 DOA: 11/01/2022 DOS: the patient was seen and examined on 11/02/2022 PCP: Patient, No Pcp Per  Patient coming from: Home - lives with wife and kids (13, 5); NOK:  Wife, [phone]   Chief Complaint: Abdominal pain  HPI: Javier Bowen is a 37 y.o. male with medical history significant of morbid obesity and diverticulitis presenting with abdominal pain.  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  He was in [place] and his stomach started feeling weird.  That night he was feeling very bloated.  He finished his drive and he went to the Er as soon as he got home.  He has had diverticulitis in the past but never that bad.  He has had maybe 2 prior flares, 4 total episodes.  He was previously treated with antibiotics from the ER, never admitted before.  He thinks the pain is started to come back but was better after morphine.  No prior referrals to GI/surgery on prior episodes.  No n/v/d.  Last Bm was yesterday AM.  He would like to try clears.  Tmax 100.8.    ER Course:  [facility] transfer, per Dr. Antionette Char:  Abdominal pain, fever, and tachycardic with leukocytosis and CT findings suggestive of acute uncomplicated sigmoid diverticulitis or colitis. Blood cultures were ordered and patient is to receive Zosyn. Lactic acid is pending.       Review of Systems: As mentioned in the history of present illness. All other systems reviewed and are negative. Past Medical History:  Diagnosis Date   Diverticulitis    Morbid obesity (HCC)    History reviewed. No pertinent surgical history. Social History:  reports that he has been smoking cigarettes. He has a 23.00 pack-year smoking history. He has never used smokeless tobacco. He reports current alcohol use. He reports that he does not currently use drugs.  No Known Allergies  Family History  Problem Relation Age of Onset   Diabetes Mother    GI Disease Neg Hx     Prior  to Admission medications   Medication Sig Start Date End Date Taking? Authorizing Provider  amoxicillin-clavulanate (AUGMENTIN) 875-125 MG tablet Take 1 tablet by mouth 2 (two) times daily. One po bid x 7 days 04/20/21   Molpus, John, MD  guaiFENesin-codeine [facility]) 100-10 MG/5ML syrup Take 5 mLs by mouth 3 (three) times daily as needed for cough. 12/10/17   Kellie Shropshire, PA-C  oxyCODONE-acetaminophen (PERCOCET) 5-325 MG tablet Take 1 tablet by mouth every 6 (six) hours as needed for severe pain. 04/20/21   Molpus, Jonny Ruiz, MD    Physical Exam: Vitals:   11/02/22 1115 11/02/22 1245 11/02/22 1249 11/02/22 1347  BP: (!) 140/91  (!) 150/107 119/74  Pulse: 90 (!) 114 (!) 107 95  Resp: (!) 21  20   Temp:   98.2 F (36.8 C) 99.2 F (37.3 C)  TempSrc:   Oral Oral  SpO2: 100% 99% 100% 99%   General:  Appears calm and comfortable and is in NAD Eyes:   EOMI, normal lids, iris ENT:  grossly normal hearing, lips & tongue, mmm; appropriate dentition Neck:  no LAD, masses or thyromegaly Cardiovascular:  RRR, no m/r/g. No LE edema.  Respiratory:   CTA bilaterally with no wheezes/rales/rhonchi.  Normal respiratory effort. Abdomen:  soft, mild diffuse TTP, ND Skin:  no rash or induration seen on limited exam Musculoskeletal:  grossly normal tone BUE/BLE, good ROM, no bony  abnormality Psychiatric:  grossly normal mood and affect, speech fluent and appropriate, AOx3 Neurologic:  CN 2-12 grossly intact, moves all extremities in coordinated fashion   Radiological Exams on Admission: Independently reviewed - see discussion in A/P where applicable  CT ABDOMEN PELVIS W CONTRAST  Result Date: 11/01/2022 CLINICAL DATA:  Generalized abdominal pain for 1 day EXAM: CT ABDOMEN AND PELVIS WITH CONTRAST TECHNIQUE: Multidetector CT imaging of the abdomen and pelvis was performed using the standard protocol following bolus administration of intravenous contrast. RADIATION DOSE REDUCTION: This exam was  performed according to the departmental dose-optimization program which includes automated exposure control, adjustment of the mA and/or kV according to patient size and/or use of iterative reconstruction technique. CONTRAST:  185mL OMNIPAQUE IOHEXOL 300 MG/ML  SOLN COMPARISON:  04/20/2021 FINDINGS: Lower chest: No acute pleural or parenchymal lung disease. Hepatobiliary: No focal liver abnormality is seen. No gallstones, gallbladder wall thickening, or biliary dilatation. Pancreas: Unremarkable. No pancreatic ductal dilatation or surrounding inflammatory changes. Spleen: Normal in size without focal abnormality. Adrenals/Urinary Tract: Adrenal glands are unremarkable. Kidneys are normal, without renal calculi, focal lesion, or hydronephrosis. The bladder is decompressed, limiting its evaluation. Stomach/Bowel: No bowel obstruction or ileus. There is segmental wall thickening of the mid sigmoid colon, with surrounding fat stranding. Findings are consistent with segmental colitis or diverticulitis. No evidence of perforation, fluid collection, or abscess. Normal appendix right lower quadrant. Vascular/Lymphatic: Stable aortic atherosclerosis. Multiple subcentimeter lymph nodes within the lower mesentery are likely reactive. No pathologic adenopathy. Reproductive: Prostate is unremarkable. Other: Trace free fluid within the lower abdominal mesentery. No free intraperitoneal gas. No abdominal wall hernia. Musculoskeletal: No acute or destructive bony lesions. Reconstructed images demonstrate no additional findings. IMPRESSION: 1. Segmental wall thickening and pericolonic fat stranding within the mid sigmoid colon, consistent with acute uncomplicated diverticulitis or colitis. No perforation, fluid collection, or abscess. Follow-up is recommended to document complete resolution. 2. Subcentimeter reactive lymph nodes within the lower abdominal mesentery. 3.  Aortic Atherosclerosis (ICD10-I70.0). Electronically Signed    By: Randa Ngo M.D.   On: 11/01/2022 20:54    EKG: Independently reviewed.  Sinus tachycardia with rate 133; no evidence of acute ischemia   Labs on Admission: I have personally reviewed the available labs and imaging studies at the time of the admission.  Pertinent labs:    Unremarkable CMP WBC 18.5 Lactate 0.8 COVID/flu/RSV negative UA; moderate Hgb, few bacteria   Assessment and Plan: Principal Problem:   Acute diverticulitis Active Problems:   Morbid obesity with BMI of 60.0-69.9, adult (HCC)   Alcohol abuse   Tobacco dependence    Diverticulitis -Patient's symptoms are c/w diverticulitis and his CT supports this as a diagnosis -Multiple SIRS criteria but no current evidence of organ failure to suggest sepsis -He appears to be stable -Will allow clear fluids for now -Will give IVF, pain medication with oxy/morphine, nausea medication with  Zofran, and treat with Rocephin/Flagyl for intraabdominal infection as per order set -Given young age and multiple prior episodes, would suggest outpatient surgery consultation for consideration of sigmoidectomy -Will admit to Med Surg  Morbid obesity -Prior BMI 61.1, current weight requested -Weight loss should be encouraged -Outpatient PCP/bariatric medicine/bariatric surgery f/u encouraged   ETOH Abuse -Binge drinks on weekends -Does not appear to require CIWA -Will monitor for symptoms  Tobacco dependence -Encourage cessation.   -This was discussed with the patient and should be reviewed on an ongoing basis.   -Patch ordered     Advance Care Planning:  Code Status: Full Code - Code status was discussed with the patient and/or family at the time of admission.  The patient would want to receive full resuscitative measures at this time.   Consults: None  DVT Prophylaxis: SCDs  Family Communication: None present; he declined to have me call family at the time of admission  Severity of Illness: The appropriate  patient status for this patient is INPATIENT. Inpatient status is judged to be reasonable and necessary in order to provide the required intensity of service to ensure the patient's safety. The patient's presenting symptoms, physical exam findings, and initial radiographic and laboratory data in the context of their chronic comorbidities is felt to place them at high risk for further clinical deterioration. Furthermore, it is not anticipated that the patient will be medically stable for discharge from the hospital within 2 midnights of admission.   * I certify that at the point of admission it is my clinical judgment that the patient will require inpatient hospital care spanning beyond 2 midnights from the point of admission due to high intensity of service, high risk for further deterioration and high frequency of surveillance required.*  Author: Karmen Bongo, MD 11/02/2022 6:12 PM  For on call review [email or web address].

## 2022-11-02 NOTE — ED Notes (Addendum)
Charted in error.

## 2022-11-02 NOTE — ED Notes (Signed)
ED TO INPATIENT HANDOFF REPORT  ED Nurse Name and Phone #: Angelica Chessman Name/Age/Gender Javier Bowen 37 y.o. male Room/Bed: MH01/MH01  Code Status   Code Status: Not on file  Home/SNF/Other Home Patient oriented to: self, place, time, and situation Is this baseline? Yes   Triage Complete: Triage complete  Chief Complaint Acute diverticulitis [K57.92]  Triage Note Pt presents to ED POV. Pt c/o generalized abd pain that began yesterday. Pain is constant and a 10/10. Pt reports a decrease in size of bowel movements. Hx of diverticulitis   Allergies No Known Allergies  Level of Care/Admitting Diagnosis ED Disposition     ED Disposition  Admit   Condition  --   Comment  Hospital Area: [facility] [[id number]]  Level of Care: Telemetry [5]  Admit to tele based on following criteria: Other see comments  Comments: sepsis  May [facility] to [facility] if equivalent level of care is available:: Yes  Interfacility transfer: Yes  Covid Evaluation: Asymptomatic - no recent exposure (last 10 days) testing not required  Diagnosis: Acute diverticulitis [9937169]  Admitting Physician: Vianne Bulls [[id number]]  Attending Physician: Vianne Bulls [[id number]]  Certification:: I certify this patient will need inpatient services for at least 2 midnights  Estimated Length of Stay: 3          B Medical/Surgery History Past Medical History:  Diagnosis Date   Morbid obesity ([place])    History reviewed. No pertinent surgical history.   A IV Location/Drains/Wounds Patient Lines/Drains/Airways Status     Active Line/Drains/Airways     Name Placement date Placement time Site Days   Peripheral IV 11/01/22 20 G Anterior;Distal;Left;Upper Arm 11/01/22  1947  Arm  1   Peripheral IV 11/01/22 20 G Right;Posterior Hand 11/01/22  2136  Hand  1            Intake/Output Last 24 hours  Intake/Output Summary (Last 24 hours) at 11/02/2022  1031 Last data filed at 11/02/2022 0127 Gross per 24 hour  Intake 2050 ml  Output --  Net 2050 ml    Labs/Imaging Results for orders placed or performed during the hospital encounter of 11/01/22 (from the past 48 hour(s))  Lipase, blood     Status: None   Collection Time: 11/01/22  7:26 PM  Result Value Ref Range   Lipase 33 11 - 51 U/L    Comment: Performed at [facility], [address] [place], [place] [address]  Comprehensive metabolic panel     Status: Abnormal   Collection Time: 11/01/22  7:26 PM  Result Value Ref Range   Sodium 136 135 - 145 mmol/L   Potassium 3.9 3.5 - 5.1 mmol/L   Chloride 100 98 - 111 mmol/L   CO2 26 22 - 32 mmol/L   Glucose, Bld 106 (H) 70 - 99 mg/dL    Comment: Glucose reference range applies only to samples taken after fasting for at least 8 hours.   BUN 13 6 - 20 mg/dL   Creatinine, Ser 1.20 0.61 - 1.24 mg/dL   Calcium 9.0 8.9 - 10.3 mg/dL   Total Protein 8.1 6.5 - 8.1 g/dL   Albumin 4.1 3.5 - 5.0 g/dL   AST 18 15 - 41 U/L   ALT 22 0 - 44 U/L   Alkaline Phosphatase 55 38 - 126 U/L   Total Bilirubin 0.6 0.3 - 1.2 mg/dL   GFR, Estimated >60 >60 mL/min  Comment: (NOTE) Calculated using the CKD-EPI Creatinine Equation (2021)    Anion gap 10 5 - 15    Comment: Performed at [facility], [address] [place], [place] [address]  CBC     Status: Abnormal   Collection Time: 11/01/22  7:26 PM  Result Value Ref Range   WBC 18.5 (H) 4.0 - 10.5 K/uL   RBC 5.64 4.22 - 5.81 MIL/uL   Hemoglobin 15.4 13.0 - 17.0 g/dL   HCT 46.7 39.0 - 52.0 %   MCV 82.8 80.0 - 100.0 fL   MCH 27.3 26.0 - 34.0 pg   MCHC 33.0 30.0 - 36.0 g/dL   RDW 15.7 (H) 11.5 - 15.5 %   Platelets 279 150 - 400 K/uL   nRBC 0.0 0.0 - 0.2 %    Comment: Performed at [facility], [address] [place], [place] [address]  Urinalysis, Routine w reflex microscopic Nasopharyngeal Swab     Status: Abnormal   Collection Time: 11/01/22  7:26 PM  Result  Value Ref Range   Color, Urine YELLOW YELLOW   APPearance CLEAR CLEAR   Specific Gravity, Urine 1.020 1.005 - 1.030   pH 6.0 5.0 - 8.0   Glucose, UA NEGATIVE NEGATIVE mg/dL   Hgb urine dipstick MODERATE (A) NEGATIVE   Bilirubin Urine NEGATIVE NEGATIVE   Ketones, ur NEGATIVE NEGATIVE mg/dL   Protein, ur NEGATIVE NEGATIVE mg/dL   Nitrite NEGATIVE NEGATIVE   Leukocytes,Ua NEGATIVE NEGATIVE    Comment: Performed at [facility], [address] [place], [place] [address]  Resp panel by RT-PCR (RSV, Flu A&B, Covid) Nasopharyngeal Swab     Status: None   Collection Time: 11/01/22  7:26 PM   Specimen: Nasopharyngeal Swab; Nasal Swab  Result Value Ref Range   SARS Coronavirus 2 by RT PCR NEGATIVE NEGATIVE    Comment: (NOTE) SARS-CoV-2 target nucleic acids are NOT DETECTED.  The SARS-CoV-2 RNA is generally detectable in upper respiratory specimens during the acute phase of infection. The lowest concentration of SARS-CoV-2 viral copies this assay can detect is 138 copies/mL. A negative result does not preclude SARS-Cov-2 infection and should not be used as the sole basis for treatment or other patient management decisions. A negative result may occur with  improper specimen collection/handling, submission of specimen other than nasopharyngeal swab, presence of viral mutation(s) within the areas targeted by this assay, and inadequate number of viral copies(<138 copies/mL). A negative result must be combined with clinical observations, patient history, and epidemiological information. The expected result is Negative.  Fact Sheet for Patients:  EntrepreneurPulse.com.au  Fact Sheet for Healthcare Providers:  IncredibleEmployment.be  This test is no t yet approved or cleared by the [place] FDA and  has been authorized for detection and/or diagnosis of SARS-CoV-2 by FDA under an Emergency Use Authorization (EUA). This EUA will remain  in  effect (meaning this test can be used) for the duration of the COVID-19 declaration under Section 564(b)(1) of the Act, 21 U.S.C.section 360bbb-3(b)(1), unless the authorization is terminated  or revoked sooner.       Influenza A by PCR NEGATIVE NEGATIVE   Influenza B by PCR NEGATIVE NEGATIVE    Comment: (NOTE) The Xpert Xpress SARS-CoV-2/FLU/RSV plus assay is intended as an aid in the diagnosis of influenza from Nasopharyngeal swab specimens and should not be used as a sole basis for treatment. Nasal washings and aspirates are unacceptable for Xpert Xpress SARS-CoV-2/FLU/RSV testing.  Fact  Sheet for Patients: BloggerCourse.com  Fact Sheet for Healthcare Providers: SeriousBroker.it  This test is not yet approved or cleared by the [place] FDA and has been authorized for detection and/or diagnosis of SARS-CoV-2 by FDA under an Emergency Use Authorization (EUA). This EUA will remain in effect (meaning this test can be used) for the duration of the COVID-19 declaration under Section 564(b)(1) of the Act, 21 U.S.C. section 360bbb-3(b)(1), unless the authorization is terminated or revoked.     Resp Syncytial Virus by PCR NEGATIVE NEGATIVE    Comment: (NOTE) Fact Sheet for Patients: BloggerCourse.com  Fact Sheet for Healthcare Providers: SeriousBroker.it  This test is not yet approved or cleared by the [place] FDA and has been authorized for detection and/or diagnosis of SARS-CoV-2 by FDA under an Emergency Use Authorization (EUA). This EUA will remain in effect (meaning this test can be used) for the duration of the COVID-19 declaration under Section 564(b)(1) of the Act, 21 U.S.C. section 360bbb-3(b)(1), unless the authorization is terminated or revoked.  Performed at [facility], [address] [place], [place] [address]   Urinalysis, Microscopic  (reflex)     Status: Abnormal   Collection Time: 11/01/22  7:26 PM  Result Value Ref Range   RBC / HPF 6-10 0 - 5 RBC/hpf   WBC, UA 0-5 0 - 5 WBC/hpf   Bacteria, UA FEW (A) NONE SEEN   Squamous Epithelial / HPF 0-5 0 - 5 /HPF   Mucus PRESENT     Comment: Performed at [facility], [address] [place], [place] [address]  Lactic acid, plasma     Status: None   Collection Time: 11/01/22  9:52 PM  Result Value Ref Range   Lactic Acid, Venous 0.8 0.5 - 1.9 mmol/L    Comment: Performed at [facility], [address] [place], [place] [address]   CT ABDOMEN PELVIS W CONTRAST  Result Date: 11/01/2022 CLINICAL DATA:  Generalized abdominal pain for 1 day EXAM: CT ABDOMEN AND PELVIS WITH CONTRAST TECHNIQUE: Multidetector CT imaging of the abdomen and pelvis was performed using the standard protocol following bolus administration of intravenous contrast. RADIATION DOSE REDUCTION: This exam was performed according to the departmental dose-optimization program which includes automated exposure control, adjustment of the mA and/or kV according to patient size and/or use of iterative reconstruction technique. CONTRAST:  OMNIPAQUE IOHEXOL 300 MG/ML  SOLN COMPARISON:  04/20/2021 FINDINGS: Lower chest: No acute pleural or parenchymal lung disease. Hepatobiliary: No focal liver abnormality is seen. No gallstones, gallbladder wall thickening, or biliary dilatation. Pancreas: Unremarkable. No pancreatic ductal dilatation or surrounding inflammatory changes. Spleen: Normal in size without focal abnormality. Adrenals/Urinary Tract: Adrenal glands are unremarkable. Kidneys are normal, without renal calculi, focal lesion, or hydronephrosis. The bladder is decompressed, limiting its evaluation. Stomach/Bowel: No bowel obstruction or ileus. There is segmental wall thickening of the mid sigmoid colon, with surrounding fat stranding. Findings are consistent with segmental colitis or  diverticulitis. No evidence of perforation, fluid collection, or abscess. Normal appendix right lower quadrant. Vascular/Lymphatic: Stable aortic atherosclerosis. Multiple subcentimeter lymph nodes within the lower mesentery are likely reactive. No pathologic adenopathy. Reproductive: Prostate is unremarkable. Other: Trace free fluid within the lower abdominal mesentery. No free intraperitoneal gas. No abdominal wall hernia. Musculoskeletal: No acute or destructive bony lesions. Reconstructed images demonstrate no additional findings. IMPRESSION: 1. Segmental wall thickening and pericolonic fat stranding within the mid sigmoid colon, consistent with acute uncomplicated diverticulitis or  colitis. No perforation, fluid collection, or abscess. Follow-up is recommended to document complete resolution. 2. Subcentimeter reactive lymph nodes within the lower abdominal mesentery. 3.  Aortic Atherosclerosis (ICD10-I70.0). Electronically Signed   By: Randa Ngo M.D.   On: 11/01/2022 20:54    Pending Labs Unresulted Labs (From admission, onward)     Start     Ordered   11/01/22 2103  Blood culture (routine x 2)  BLOOD CULTURE X 2,   STAT      11/01/22 2102            Vitals/Pain Today's Vitals   11/02/22 0930 11/02/22 0945 11/02/22 0956 11/02/22 1001  BP:  116/80    Pulse:      Resp: (!) 22 18    Temp:   98.2 F (36.8 C)   TempSrc:   Oral   SpO2:      PainSc:   4  4     Isolation Precautions No active isolations  Medications Medications  nicotine (NICODERM CQ - dosed in mg/24 hours) patch 21 mg (21 mg Transdermal Patch Applied 11/01/22 2222)  acetaminophen (TYLENOL) tablet 650 mg (650 mg Oral Given 11/01/22 1925)  iohexol (OMNIPAQUE) 300 MG/ML solution 100 mL (100 mLs Intravenous Contrast Given 11/01/22 2025)  lactated ringers bolus 1,000 mL (0 mLs Intravenous Stopped 11/02/22 0127)  morphine (PF) 4 MG/ML injection 4 mg (4 mg Intravenous Given 11/01/22 2042)  piperacillin-tazobactam (ZOSYN)  IVPB 3.375 g (0 g Intravenous Stopped 11/01/22 2223)  lactated ringers bolus 1,000 mL (0 mLs Intravenous Stopped 11/02/22 0127)  morphine (PF) 4 MG/ML injection 4 mg (4 mg Intravenous Given 11/02/22 0744)    Mobility walks Moderate fall risk   Focused Assessments Gastroenterology Services for Diverticulosis   R Recommendations: See Admitting Provider Note  Report given to: IP RN Additional Notes: Diverticulosis and ABT therapy

## 2022-11-03 DIAGNOSIS — K5792 Diverticulitis of intestine, part unspecified, without perforation or abscess without bleeding: Secondary | ICD-10-CM | POA: Diagnosis not present

## 2022-11-03 LAB — BASIC METABOLIC PANEL
Anion gap: 9 (ref 5–15)
BUN: 8 mg/dL (ref 6–20)
CO2: 26 mmol/L (ref 22–32)
Calcium: 8.5 mg/dL — ABNORMAL LOW (ref 8.9–10.3)
Chloride: 102 mmol/L (ref 98–111)
Creatinine, Ser: 0.97 mg/dL (ref 0.61–1.24)
GFR, Estimated: >60 mL/min (ref >60)
Glucose, Bld: 109 mg/dL — ABNORMAL HIGH (ref 70–99)
Potassium: 3.9 mmol/L (ref 3.5–5.1)
Sodium: 137 mmol/L (ref 135–145)

## 2022-11-03 LAB — CBC WITH DIFFERENTIAL/PLATELET
Abs Immature Granulocytes: 0.08 10*3/uL — ABNORMAL HIGH (ref 0.00–0.07)
Basophils Absolute: 0.1 10*3/uL (ref 0.0–0.1)
Basophils Relative: 0 %
Eosinophils Absolute: 0.6 10*3/uL — ABNORMAL HIGH (ref 0.0–0.5)
Eosinophils Relative: 4 %
HCT: 41.2 % (ref 39.0–52.0)
Hemoglobin: 13.7 g/dL (ref 13.0–17.0)
Immature Granulocytes: 1 %
Lymphocytes Relative: 37 %
Lymphs Abs: 4.8 10*3/uL — ABNORMAL HIGH (ref 0.7–4.0)
MCH: 27.5 pg (ref 26.0–34.0)
MCHC: 33.3 g/dL (ref 30.0–36.0)
MCV: 82.6 fL (ref 80.0–100.0)
Monocytes Absolute: 0.9 10*3/uL (ref 0.1–1.0)
Monocytes Relative: 7 %
Neutro Abs: 6.6 10*3/uL (ref 1.7–7.7)
Neutrophils Relative %: 51 %
Platelets: 226 10*3/uL (ref 150–400)
RBC: 4.99 MIL/uL (ref 4.22–5.81)
RDW: 15.2 % (ref 11.5–15.5)
WBC: 13.1 10*3/uL — ABNORMAL HIGH (ref 4.0–10.5)
nRBC: 0 % (ref 0.0–0.2)

## 2022-11-03 LAB — MAGNESIUM: Magnesium: 2 mg/dL (ref 1.7–2.4)

## 2022-11-03 LAB — HIV ANTIBODY (ROUTINE TESTING W REFLEX): HIV Screen 4th Generation wRfx: NONREACTIVE

## 2022-11-03 NOTE — Progress Notes (Signed)
PROGRESS NOTE    Javier Bowen  XVQ:[id number] DOB: October 31, 1985 DOA: 11/01/2022 PCP: Patient, No Pcp Per   Brief Narrative:  37 y.o. male with medical history significant of morbid obesity and diverticulitis presented with worsening abdominal pain.  On presentation, he was found to have acute uncomplicated sigmoid diverticulitis with colitis.  He was started on IV fluids and antibiotics.  Assessment & Plan:   Acute diverticulitis -Patient apparently has had few bouts of acute diverticulitis but this is his first hospitalization. -Improving.  Tolerating clear liquid.  Advance diet to soft diet today. -Currently on broad-spectrum antibiotics.  If continues to do well, possible discharge tomorrow.  Will need outpatient GI evaluation.  Leukocytosis -Improving.  Monitor  Morbid obesity -Outpatient follow-up.  Alcohol abuse -Showing no signs of withdrawal.  Monitor.  Tobacco dependence  -Currently on nicotine patch.  Admitting provider discussed regarding cessation with the patient  DVT prophylaxis: SCDs Code Status: Full Family Communication: Wife at bedside Disposition Plan: Status is: Inpatient Remains inpatient appropriate because: Of severity of illness    Consultants: None  Procedures: None  Antimicrobials:  Anti-infectives (From admission, onward)    Start     Dose/Rate Route Frequency Ordered Stop   11/02/22 1600  cefTRIAXone (ROCEPHIN) 2 g in sodium chloride 0.9 % 100 mL IVPB       See Hyperspace for full Linked Orders Report.   2 g 200 mL/hr over 30 Minutes Intravenous Every 24 hours 11/02/22 1420 11/09/22 1559   11/02/22 1600  metroNIDAZOLE (FLAGYL) IVPB 500 mg       See Hyperspace for full Linked Orders Report.   500 mg 100 mL/hr over 60 Minutes Intravenous Every 12 hours 11/02/22 1420 11/09/22 1559   11/01/22 2115  piperacillin-tazobactam (ZOSYN) IVPB 3.375 g        3.375 g 100 mL/hr over 30 Minutes Intravenous  Once 11/01/22 2101 11/01/22 2223         Subjective: Patient seen and examined at bedside.  Feels slightly better.  Tolerating clear liquid diet.  Denies any current nausea or vomiting.  Still having intermittent abdominal pain.  No fever reported.  Objective: Vitals:   11/02/22 2143 11/03/22 0059 11/03/22 0541 11/03/22 0817  BP: (!) 99/42 113/69 (!) 102/59 103/62  Pulse: 92 88 83 85  Resp: 20 18 20 16   Temp: 98.7 F (37.1 C) 97.9 F (36.6 C) 98.3 F (36.8 C) 98.3 F (36.8 C)  TempSrc: Oral Oral Oral Oral  SpO2: 100% 94% 100% 100%   No intake or output data in the 24 hours ending 11/03/22 1009 There were no vitals filed for this visit.  Examination:  General exam: Appears calm and comfortable.  On room air. Respiratory system: Bilateral decreased breath sounds at bases Cardiovascular system: S1 & S2 heard, Rate controlled Gastrointestinal system: Abdomen is morbidly obese, slightly distended, soft and mildly tender in the lower quadrant.  Normal bowel sounds heard. Extremities: No cyanosis, clubbing, edema    Data Reviewed: I have personally reviewed following labs and imaging studies  CBC: Recent Labs  Lab 11/01/22 1926 11/03/22 0304  WBC 18.5* 13.1*  NEUTROABS  --  6.6  HGB 15.4 13.7  HCT 46.7 41.2  MCV 82.8 82.6  PLT 279 093   Basic Metabolic Panel: Recent Labs  Lab 11/01/22 1926 11/03/22 0304  NA 136 137  K 3.9 3.9  CL 100 102  CO2 26 26  GLUCOSE 106* 109*  BUN 13 8  CREATININE 1.20 0.97  CALCIUM 9.0  8.5*  MG  --  2.0   GFR: CrCl cannot be calculated (Unknown ideal weight.). Liver Function Tests: Recent Labs  Lab 11/01/22 1926  AST 18  ALT 22  ALKPHOS 55  BILITOT 0.6  PROT 8.1  ALBUMIN 4.1   Recent Labs  Lab 11/01/22 1926  LIPASE 33   No results for input(s): "AMMONIA" in the last 168 hours. Coagulation Profile: No results for input(s): "INR", "PROTIME" in the last 168 hours. Cardiac Enzymes: No results for input(s): "CKTOTAL", "CKMB", "CKMBINDEX", "TROPONINI" in  the last 168 hours. BNP (last 3 results) No results for input(s): "PROBNP" in the last 8760 hours. HbA1C: No results for input(s): "HGBA1C" in the last 72 hours. CBG: No results for input(s): "GLUCAP" in the last 168 hours. Lipid Profile: No results for input(s): "CHOL", "HDL", "LDLCALC", "TRIG", "CHOLHDL", "LDLDIRECT" in the last 72 hours. Thyroid Function Tests: No results for input(s): "TSH", "T4TOTAL", "FREET4", "T3FREE", "THYROIDAB" in the last 72 hours. Anemia Panel: No results for input(s): "VITAMINB12", "FOLATE", "FERRITIN", "TIBC", "IRON", "RETICCTPCT" in the last 72 hours. Sepsis Labs: Recent Labs  Lab 11/01/22 2152  LATICACIDVEN 0.8    Recent Results (from the past 240 hour(s))  Resp panel by RT-PCR (RSV, Flu A&B, Covid) Nasopharyngeal Swab     Status: None   Collection Time: 11/01/22  7:26 PM   Specimen: Nasopharyngeal Swab; Nasal Swab  Result Value Ref Range Status   SARS Coronavirus 2 by RT PCR NEGATIVE NEGATIVE Final    Comment: (NOTE) SARS-CoV-2 target nucleic acids are NOT DETECTED.  The SARS-CoV-2 RNA is generally detectable in upper respiratory specimens during the acute phase of infection. The lowest concentration of SARS-CoV-2 viral copies this assay can detect is 138 copies/mL. A negative result does not preclude SARS-Cov-2 infection and should not be used as the sole basis for treatment or other patient management decisions. A negative result may occur with  improper specimen collection/handling, submission of specimen other than nasopharyngeal swab, presence of viral mutation(s) within the areas targeted by this assay, and inadequate number of viral copies(<138 copies/mL). A negative result must be combined with clinical observations, patient history, and epidemiological information. The expected result is Negative.  Fact Sheet for Patients:  BloggerCourse.com  Fact Sheet for Healthcare Providers:   SeriousBroker.it  This test is no t yet approved or cleared by the [place] FDA and  has been authorized for detection and/or diagnosis of SARS-CoV-2 by FDA under an Emergency Use Authorization (EUA). This EUA will remain  in effect (meaning this test can be used) for the duration of the COVID-19 declaration under Section 564(b)(1) of the Act, 21 U.S.C.section 360bbb-3(b)(1), unless the authorization is terminated  or revoked sooner.       Influenza A by PCR NEGATIVE NEGATIVE Final   Influenza B by PCR NEGATIVE NEGATIVE Final    Comment: (NOTE) The Xpert Xpress SARS-CoV-2/FLU/RSV plus assay is intended as an aid in the diagnosis of influenza from Nasopharyngeal swab specimens and should not be used as a sole basis for treatment. Nasal washings and aspirates are unacceptable for Xpert Xpress SARS-CoV-2/FLU/RSV testing.  Fact Sheet for Patients: BloggerCourse.com  Fact Sheet for Healthcare Providers: SeriousBroker.it  This test is not yet approved or cleared by the [place] FDA and has been authorized for detection and/or diagnosis of SARS-CoV-2 by FDA under an Emergency Use Authorization (EUA). This EUA will remain in effect (meaning this test can be used) for the duration of the COVID-19 declaration under Section 564(b)(1) of the  Act, 21 U.S.C. section 360bbb-3(b)(1), unless the authorization is terminated or revoked.     Resp Syncytial Virus by PCR NEGATIVE NEGATIVE Final    Comment: (NOTE) Fact Sheet for Patients: EntrepreneurPulse.com.au  Fact Sheet for Healthcare Providers: IncredibleEmployment.be  This test is not yet approved or cleared by the [place] FDA and has been authorized for detection and/or diagnosis of SARS-CoV-2 by FDA under an Emergency Use Authorization (EUA). This EUA will remain in effect (meaning this test can be used) for  the duration of the COVID-19 declaration under Section 564(b)(1) of the Act, 21 U.S.C. section 360bbb-3(b)(1), unless the authorization is terminated or revoked.  Performed at [facility], [address], [place] [address]          Radiology Studies: CT ABDOMEN PELVIS W CONTRAST  Result Date: 11/01/2022 CLINICAL DATA:  Generalized abdominal pain for 1 day EXAM: CT ABDOMEN AND PELVIS WITH CONTRAST TECHNIQUE: Multidetector CT imaging of the abdomen and pelvis was performed using the standard protocol following bolus administration of intravenous contrast. RADIATION DOSE REDUCTION: This exam was performed according to the departmental dose-optimization program which includes automated exposure control, adjustment of the mA and/or kV according to patient size and/or use of iterative reconstruction technique. CONTRAST:  155mL OMNIPAQUE IOHEXOL 300 MG/ML  SOLN COMPARISON:  04/20/2021 FINDINGS: Lower chest: No acute pleural or parenchymal lung disease. Hepatobiliary: No focal liver abnormality is seen. No gallstones, gallbladder wall thickening, or biliary dilatation. Pancreas: Unremarkable. No pancreatic ductal dilatation or surrounding inflammatory changes. Spleen: Normal in size without focal abnormality. Adrenals/Urinary Tract: Adrenal glands are unremarkable. Kidneys are normal, without renal calculi, focal lesion, or hydronephrosis. The bladder is decompressed, limiting its evaluation. Stomach/Bowel: No bowel obstruction or ileus. There is segmental wall thickening of the mid sigmoid colon, with surrounding fat stranding. Findings are consistent with segmental colitis or diverticulitis. No evidence of perforation, fluid collection, or abscess. Normal appendix right lower quadrant. Vascular/Lymphatic: Stable aortic atherosclerosis. Multiple subcentimeter lymph nodes within the lower mesentery are likely reactive. No pathologic adenopathy. Reproductive: Prostate is unremarkable.  Other: Trace free fluid within the lower abdominal mesentery. No free intraperitoneal gas. No abdominal wall hernia. Musculoskeletal: No acute or destructive bony lesions. Reconstructed images demonstrate no additional findings. IMPRESSION: 1. Segmental wall thickening and pericolonic fat stranding within the mid sigmoid colon, consistent with acute uncomplicated diverticulitis or colitis. No perforation, fluid collection, or abscess. Follow-up is recommended to document complete resolution. 2. Subcentimeter reactive lymph nodes within the lower abdominal mesentery. 3.  Aortic Atherosclerosis (ICD10-I70.0). Electronically Signed   By: Randa Ngo M.D.   On: 11/01/2022 20:54        Scheduled Meds:  nicotine  21 mg Transdermal Daily   Continuous Infusions:  cefTRIAXone (ROCEPHIN)  IV 2 g (11/02/22 1727)   And   metronidazole 500 mg (11/03/22 0531)   lactated ringers 75 mL/hr at 11/02/22 1501          Rorie Delmore, MD [facility] 11/03/2022, 10:09 AM

## 2022-11-04 ENCOUNTER — Encounter: Payer: Self-pay | Admitting: Gastroenterology

## 2022-11-04 DIAGNOSIS — Z6841 Body Mass Index (BMI) 40.0 and over, adult: Secondary | ICD-10-CM | POA: Diagnosis not present

## 2022-11-04 DIAGNOSIS — F17200 Nicotine dependence, unspecified, uncomplicated: Secondary | ICD-10-CM

## 2022-11-04 DIAGNOSIS — K5792 Diverticulitis of intestine, part unspecified, without perforation or abscess without bleeding: Secondary | ICD-10-CM | POA: Diagnosis not present

## 2022-11-04 LAB — CBC WITH DIFFERENTIAL/PLATELET
Abs Immature Granulocytes: 0.07 10*3/uL (ref 0.00–0.07)
Basophils Absolute: 0 10*3/uL (ref 0.0–0.1)
Basophils Relative: 0 %
Eosinophils Absolute: 0.4 10*3/uL (ref 0.0–0.5)
Eosinophils Relative: 3 %
HCT: 43.8 % (ref 39.0–52.0)
Hemoglobin: 14.2 g/dL (ref 13.0–17.0)
Immature Granulocytes: 1 %
Lymphocytes Relative: 40 %
Lymphs Abs: 4.6 10*3/uL — ABNORMAL HIGH (ref 0.7–4.0)
MCH: 27 pg (ref 26.0–34.0)
MCHC: 32.4 g/dL (ref 30.0–36.0)
MCV: 83.4 fL (ref 80.0–100.0)
Monocytes Absolute: 0.9 10*3/uL (ref 0.1–1.0)
Monocytes Relative: 8 %
Neutro Abs: 5.6 10*3/uL (ref 1.7–7.7)
Neutrophils Relative %: 48 %
Platelets: 241 10*3/uL (ref 150–400)
RBC: 5.25 MIL/uL (ref 4.22–5.81)
RDW: 14.8 % (ref 11.5–15.5)
WBC: 11.5 10*3/uL — ABNORMAL HIGH (ref 4.0–10.5)
nRBC: 0 % (ref 0.0–0.2)

## 2022-11-04 LAB — BASIC METABOLIC PANEL
Anion gap: 9 (ref 5–15)
BUN: 10 mg/dL (ref 6–20)
CO2: 25 mmol/L (ref 22–32)
Calcium: 8.6 mg/dL — ABNORMAL LOW (ref 8.9–10.3)
Chloride: 104 mmol/L (ref 98–111)
Creatinine, Ser: 1.03 mg/dL (ref 0.61–1.24)
GFR, Estimated: >60 mL/min (ref >60)
Glucose, Bld: 95 mg/dL (ref 70–99)
Potassium: 4 mmol/L (ref 3.5–5.1)
Sodium: 138 mmol/L (ref 135–145)

## 2022-11-04 LAB — MAGNESIUM: Magnesium: 2.2 mg/dL (ref 1.7–2.4)

## 2022-11-04 MED ORDER — POLYETHYLENE GLYCOL 3350 17 G PO PACK: 17.0000 g | PACK | Freq: Every day | ORAL | 0 refills | Status: AC | PRN

## 2022-11-04 MED ORDER — OXYCODONE HCL 5 MG PO TABS: 5.0000 mg | ORAL_TABLET | Freq: Four times a day (QID) | ORAL | 0 refills | Status: DC | PRN

## 2022-11-04 MED ORDER — AMOXICILLIN-POT CLAVULANATE 875-125 MG PO TABS: 1.0000 | ORAL_TABLET | Freq: Two times a day (BID) | ORAL | 0 refills | Status: AC

## 2022-11-04 NOTE — TOC CM/SW Note (Signed)
Follow up appointment scheduled with Tavien Mapp November 11, 2022 at 10:15 am . Information placed on AVS . Secure chatted nurse.    Transition of Care [facility]) Screening Note   Patient Details  Name: Javier Bowen Date of Birth: Nov 15, 1985     Transition of Care Department [facility]) has reviewed patient and no TOC needs have been identified at this time. We will continue to monitor patient advancement through interdisciplinary progression rounds. If new patient transition needs arise, please place a TOC consult.

## 2022-11-04 NOTE — Discharge Summary (Signed)
Physician Discharge Summary  Javier Bowen NAT:[id number] DOB: Apr 03, 1986 DOA: 11/01/2022  PCP: Patient, No Pcp Per  Admit date: 11/01/2022 Discharge date: 11/04/2022  Admitted From: Home Disposition: Home  Recommendations for Outpatient Follow-up:  Follow up with PCP in 1 week with repeat CBC/BMP Outpatient evaluation and follow-up by GI Follow up in ED if symptoms worsen or new appear   Home Health: No Equipment/Devices: None  Discharge Condition: Stable CODE STATUS: Full Diet recommendation: Regular/soft diet  Brief/Interim Summary: 37 y.o. male with medical history significant of morbid obesity and diverticulitis presented with worsening abdominal pain.  On presentation, he was found to have acute uncomplicated sigmoid diverticulitis with colitis.  He was started on IV fluids and antibiotics.  During the hospitalization, he was improved significantly.  He is currently tolerating diet and white count has also much improved.  He is afebrile and wants to go home today.  He will be discharged home today on oral Augmentin for another 7 days.  Discharge Diagnoses:   Acute diverticulitis -Patient apparently has had few bouts of acute diverticulitis but this is his first hospitalization. -Improving.  Tolerating soft diet -Currently on broad-spectrum antibiotics. white count has also much improved.  He is afebrile and wants to go home today.  He will be discharged home today on oral Augmentin for another 7 days. Will need outpatient GI evaluation.   Leukocytosis -Improving.    Morbid obesity -Outpatient follow-up.   Alcohol abuse -Showing no signs of withdrawal.  Outpatient follow-up with PCP.  Patient does not have a PCP currently: Consulted TOC regarding the same   Tobacco dependence  -Currently on nicotine patch.  Admitting provider discussed regarding cessation with the patient -Outpatient follow-up with PCP.  Discharge Instructions  Discharge Instructions     Ambulatory  referral to Gastroenterology   Complete by: As directed    What is the reason for referral?: Other   Diet general   Complete by: As directed    Increase activity slowly   Complete by: As directed       Allergies as of 11/04/2022   No Known Allergies      Medication List     TAKE these medications    amoxicillin-clavulanate 875-125 MG tablet Commonly known as: AUGMENTIN Take 1 tablet by mouth 2 (two) times daily for 7 days.   oxyCODONE 5 MG immediate release tablet Commonly known as: Oxy IR/ROXICODONE Take 1-2 tablets (5-10 mg total) by mouth every 6 (six) hours as needed for moderate pain.   polyethylene glycol 17 g packet Commonly known as: MiraLax Take 17 g by mouth daily as needed for moderate constipation.        Follow-up Information     PCP. Schedule an appointment as soon as possible for a visit in 1 week(s).                 No Known Allergies  Consultations: None   Procedures/Studies: CT ABDOMEN PELVIS W CONTRAST  Result Date: 11/01/2022 CLINICAL DATA:  Generalized abdominal pain for 1 day EXAM: CT ABDOMEN AND PELVIS WITH CONTRAST TECHNIQUE: Multidetector CT imaging of the abdomen and pelvis was performed using the standard protocol following bolus administration of intravenous contrast. RADIATION DOSE REDUCTION: This exam was performed according to the departmental dose-optimization program which includes automated exposure control, adjustment of the mA and/or kV according to patient size and/or use of iterative reconstruction technique. CONTRAST:  165mL OMNIPAQUE IOHEXOL 300 MG/ML  SOLN COMPARISON:  04/20/2021 FINDINGS: Lower chest: No acute  pleural or parenchymal lung disease. Hepatobiliary: No focal liver abnormality is seen. No gallstones, gallbladder wall thickening, or biliary dilatation. Pancreas: Unremarkable. No pancreatic ductal dilatation or surrounding inflammatory changes. Spleen: Normal in size without focal abnormality. Adrenals/Urinary  Tract: Adrenal glands are unremarkable. Kidneys are normal, without renal calculi, focal lesion, or hydronephrosis. The bladder is decompressed, limiting its evaluation. Stomach/Bowel: No bowel obstruction or ileus. There is segmental wall thickening of the mid sigmoid colon, with surrounding fat stranding. Findings are consistent with segmental colitis or diverticulitis. No evidence of perforation, fluid collection, or abscess. Normal appendix right lower quadrant. Vascular/Lymphatic: Stable aortic atherosclerosis. Multiple subcentimeter lymph nodes within the lower mesentery are likely reactive. No pathologic adenopathy. Reproductive: Prostate is unremarkable. Other: Trace free fluid within the lower abdominal mesentery. No free intraperitoneal gas. No abdominal wall hernia. Musculoskeletal: No acute or destructive bony lesions. Reconstructed images demonstrate no additional findings. IMPRESSION: 1. Segmental wall thickening and pericolonic fat stranding within the mid sigmoid colon, consistent with acute uncomplicated diverticulitis or colitis. No perforation, fluid collection, or abscess. Follow-up is recommended to document complete resolution. 2. Subcentimeter reactive lymph nodes within the lower abdominal mesentery. 3.  Aortic Atherosclerosis (ICD10-I70.0). Electronically Signed   By: Randa Ngo M.D.   On: 11/01/2022 20:54      Subjective: Patient seen and examined at bedside.  Still has some intermittent abdominal pain but tolerating diet and feels okay to go home today.  No fever or vomiting reported.  Discharge Exam: Vitals:   11/04/22 0504 11/04/22 0809  BP: 133/82 135/77  Pulse: 79 63  Resp:  18  Temp: 97.6 F (36.4 C) (!) 97.4 F (36.3 C)  SpO2: 98% 97%    General: Pt is alert, awake, not in acute distress Cardiovascular: rate controlled, S1/S2 + Respiratory: bilateral decreased breath sounds at bases Abdominal: Soft, morbidly obese, mild lower quadrant tenderness present,  slightly distended, bowel sounds + Extremities: no edema, no cyanosis    The results of significant diagnostics from this hospitalization (including imaging, microbiology, ancillary and laboratory) are listed below for reference.     Microbiology: Recent Results (from the past 240 hour(s))  Resp panel by RT-PCR (RSV, Flu A&B, Covid) Nasopharyngeal Swab     Status: None   Collection Time: 11/01/22  7:26 PM   Specimen: Nasopharyngeal Swab; Nasal Swab  Result Value Ref Range Status   SARS Coronavirus 2 by RT PCR NEGATIVE NEGATIVE Final    Comment: (NOTE) SARS-CoV-2 target nucleic acids are NOT DETECTED.  The SARS-CoV-2 RNA is generally detectable in upper respiratory specimens during the acute phase of infection. The lowest concentration of SARS-CoV-2 viral copies this assay can detect is 138 copies/mL. A negative result does not preclude SARS-Cov-2 infection and should not be used as the sole basis for treatment or other patient management decisions. A negative result may occur with  improper specimen collection/handling, submission of specimen other than nasopharyngeal swab, presence of viral mutation(s) within the areas targeted by this assay, and inadequate number of viral copies(<138 copies/mL). A negative result must be combined with clinical observations, patient history, and epidemiological information. The expected result is Negative.  Fact Sheet for Patients:  EntrepreneurPulse.com.au  Fact Sheet for Healthcare Providers:  IncredibleEmployment.be  This test is no t yet approved or cleared by the [place] FDA and  has been authorized for detection and/or diagnosis of SARS-CoV-2 by FDA under an Emergency Use Authorization (EUA). This EUA will remain  in effect (meaning this test can be used) for  the duration of the COVID-19 declaration under Section 564(b)(1) of the Act, 21 U.S.C.section 360bbb-3(b)(1), unless the authorization is  terminated  or revoked sooner.       Influenza A by PCR NEGATIVE NEGATIVE Final   Influenza B by PCR NEGATIVE NEGATIVE Final    Comment: (NOTE) The Xpert Xpress SARS-CoV-2/FLU/RSV plus assay is intended as an aid in the diagnosis of influenza from Nasopharyngeal swab specimens and should not be used as a sole basis for treatment. Nasal washings and aspirates are unacceptable for Xpert Xpress SARS-CoV-2/FLU/RSV testing.  Fact Sheet for Patients: BloggerCourse.com  Fact Sheet for Healthcare Providers: SeriousBroker.it  This test is not yet approved or cleared by the [place] FDA and has been authorized for detection and/or diagnosis of SARS-CoV-2 by FDA under an Emergency Use Authorization (EUA). This EUA will remain in effect (meaning this test can be used) for the duration of the COVID-19 declaration under Section 564(b)(1) of the Act, 21 U.S.C. section 360bbb-3(b)(1), unless the authorization is terminated or revoked.     Resp Syncytial Virus by PCR NEGATIVE NEGATIVE Final    Comment: (NOTE) Fact Sheet for Patients: BloggerCourse.com  Fact Sheet for Healthcare Providers: SeriousBroker.it  This test is not yet approved or cleared by the [place] FDA and has been authorized for detection and/or diagnosis of SARS-CoV-2 by FDA under an Emergency Use Authorization (EUA). This EUA will remain in effect (meaning this test can be used) for the duration of the COVID-19 declaration under Section 564(b)(1) of the Act, 21 U.S.C. section 360bbb-3(b)(1), unless the authorization is terminated or revoked.  Performed at [facility], [address] [place], [place] [address]   Blood culture (routine x 2)     Status: None (Preliminary result)   Collection Time: 11/01/22  9:43 PM   Specimen: BLOOD RIGHT HAND  Result Value Ref Range Status   Specimen Description    Final    BLOOD RIGHT HAND Performed at [facility], [address] [place], [place] [address]    Special Requests   Final    BOTTLES DRAWN AEROBIC AND ANAEROBIC Blood Culture adequate volume Performed at [facility], [address] [place], [place] [address]    Culture   Final    NO GROWTH < 24 HOURS Performed at [facility], [address], [place], [place] [address]    Report Status PENDING  Incomplete  Blood culture (routine x 2)     Status: None (Preliminary result)   Collection Time: 11/01/22  9:43 PM   Specimen: BLOOD  Result Value Ref Range Status   Specimen Description   Final    BLOOD RIGHT ANTECUBITAL Performed at [facility], [address] [place], [place] [address]    Special Requests   Final    BOTTLES DRAWN AEROBIC AND ANAEROBIC Blood Culture adequate volume Performed at [facility], [address] [place], [place] [address]    Culture   Final    NO GROWTH < 24 HOURS Performed at [facility], [address], [place], [place] [address]    Report Status PENDING  Incomplete     Labs: BNP (last 3 results) No results for input(s): "BNP" in the last 8760 hours. Basic Metabolic Panel: Recent Labs  Lab 11/01/22 1926 11/03/22 0304 11/04/22 0318  NA 136 137 138  K 3.9 3.9 4.0  CL 100 102 104  CO2 26 26  25  GLUCOSE 106* 109* 95  BUN 13 8 10   CREATININE 1.20 0.97 1.03  CALCIUM 9.0 8.5* 8.6*  MG  --  2.0 2.2   Liver Function Tests: Recent Labs  Lab 11/01/22 1926  AST 18  ALT 22  ALKPHOS 55  BILITOT 0.6  PROT 8.1  ALBUMIN 4.1   Recent Labs  Lab 11/01/22 1926  LIPASE 33   No results for input(s): "AMMONIA" in the last 168 hours. CBC: Recent Labs  Lab 11/01/22 1926 11/03/22 0304 11/04/22 0318  WBC 18.5* 13.1* 11.5*  NEUTROABS  --  6.6 5.6  HGB 15.4 13.7 14.2  HCT 46.7 41.2 43.8  MCV 82.8 82.6 83.4  PLT 279 226 241   Cardiac Enzymes: No results for input(s): "CKTOTAL",  "CKMB", "CKMBINDEX", "TROPONINI" in the last 168 hours. BNP: Invalid input(s): "POCBNP" CBG: No results for input(s): "GLUCAP" in the last 168 hours. D-Dimer No results for input(s): "DDIMER" in the last 72 hours. Hgb A1c No results for input(s): "HGBA1C" in the last 72 hours. Lipid Profile No results for input(s): "CHOL", "HDL", "LDLCALC", "TRIG", "CHOLHDL", "LDLDIRECT" in the last 72 hours. Thyroid function studies No results for input(s): "TSH", "T4TOTAL", "T3FREE", "THYROIDAB" in the last 72 hours.  Invalid input(s): "FREET3" Anemia work up No results for input(s): "VITAMINB12", "FOLATE", "FERRITIN", "TIBC", "IRON", "RETICCTPCT" in the last 72 hours. Urinalysis    Component Value Date/Time   COLORURINE YELLOW 11/01/2022 1926   APPEARANCEUR CLEAR 11/01/2022 1926   LABSPEC 1.020 11/01/2022 1926   PHURINE 6.0 11/01/2022 1926   GLUCOSEU NEGATIVE 11/01/2022 1926   HGBUR MODERATE (A) 11/01/2022 1926   BILIRUBINUR NEGATIVE 11/01/2022 1926   KETONESUR NEGATIVE 11/01/2022 1926   PROTEINUR NEGATIVE 11/01/2022 1926   NITRITE NEGATIVE 11/01/2022 1926   LEUKOCYTESUR NEGATIVE 11/01/2022 1926   Sepsis Labs Recent Labs  Lab 11/01/22 1926 11/03/22 0304 11/04/22 0318  WBC 18.5* 13.1* 11.5*   Microbiology Recent Results (from the past 240 hour(s))  Resp panel by RT-PCR (RSV, Flu A&B, Covid) Nasopharyngeal Swab     Status: None   Collection Time: 11/01/22  7:26 PM   Specimen: Nasopharyngeal Swab; Nasal Swab  Result Value Ref Range Status   SARS Coronavirus 2 by RT PCR NEGATIVE NEGATIVE Final    Comment: (NOTE) SARS-CoV-2 target nucleic acids are NOT DETECTED.  The SARS-CoV-2 RNA is generally detectable in upper respiratory specimens during the acute phase of infection. The lowest concentration of SARS-CoV-2 viral copies this assay can detect is 138 copies/mL. A negative result does not preclude SARS-Cov-2 infection and should not be used as the sole basis for treatment or other  patient management decisions. A negative result may occur with  improper specimen collection/handling, submission of specimen other than nasopharyngeal swab, presence of viral mutation(s) within the areas targeted by this assay, and inadequate number of viral copies(<138 copies/mL). A negative result must be combined with clinical observations, patient history, and epidemiological information. The expected result is Negative.  Fact Sheet for Patients:  11/03/22  Fact Sheet for Healthcare Providers:  BloggerCourse.com  This test is no t yet approved or cleared by the [place] FDA and  has been authorized for detection and/or diagnosis of SARS-CoV-2 by FDA under an Emergency Use Authorization (EUA). This EUA will remain  in effect (meaning this test can be used) for the duration of the COVID-19 declaration under Section 564(b)(1) of the Act, 21 U.S.C.section 360bbb-3(b)(1), unless the authorization is terminated  or revoked sooner.  Influenza A by PCR NEGATIVE NEGATIVE Final   Influenza B by PCR NEGATIVE NEGATIVE Final    Comment: (NOTE) The Xpert Xpress SARS-CoV-2/FLU/RSV plus assay is intended as an aid in the diagnosis of influenza from Nasopharyngeal swab specimens and should not be used as a sole basis for treatment. Nasal washings and aspirates are unacceptable for Xpert Xpress SARS-CoV-2/FLU/RSV testing.  Fact Sheet for Patients: BloggerCourse.com  Fact Sheet for Healthcare Providers: SeriousBroker.it  This test is not yet approved or cleared by the [place] FDA and has been authorized for detection and/or diagnosis of SARS-CoV-2 by FDA under an Emergency Use Authorization (EUA). This EUA will remain in effect (meaning this test can be used) for the duration of the COVID-19 declaration under Section 564(b)(1) of the Act, 21 U.S.C. section  360bbb-3(b)(1), unless the authorization is terminated or revoked.     Resp Syncytial Virus by PCR NEGATIVE NEGATIVE Final    Comment: (NOTE) Fact Sheet for Patients: BloggerCourse.com  Fact Sheet for Healthcare Providers: SeriousBroker.it  This test is not yet approved or cleared by the [place] FDA and has been authorized for detection and/or diagnosis of SARS-CoV-2 by FDA under an Emergency Use Authorization (EUA). This EUA will remain in effect (meaning this test can be used) for the duration of the COVID-19 declaration under Section 564(b)(1) of the Act, 21 U.S.C. section 360bbb-3(b)(1), unless the authorization is terminated or revoked.  Performed at [facility], [address] [place], [place] [address]   Blood culture (routine x 2)     Status: None (Preliminary result)   Collection Time: 11/01/22  9:43 PM   Specimen: BLOOD RIGHT HAND  Result Value Ref Range Status   Specimen Description   Final    BLOOD RIGHT HAND Performed at [facility], [address] [place], [place] [address]    Special Requests   Final    BOTTLES DRAWN AEROBIC AND ANAEROBIC Blood Culture adequate volume Performed at [facility], [address] [place], [place] [address]    Culture   Final    NO GROWTH < 24 HOURS Performed at [facility], [address] [place], [place] [address]    Report Status PENDING  Incomplete  Blood culture (routine x 2)     Status: None (Preliminary result)   Collection Time: 11/01/22  9:43 PM   Specimen: BLOOD  Result Value Ref Range Status   Specimen Description   Final    BLOOD RIGHT ANTECUBITAL Performed at [facility], [address] [place], [place] [address]    Special Requests   Final    BOTTLES DRAWN AEROBIC AND ANAEROBIC Blood Culture adequate volume Performed at [facility], [address] [place], [place] [address]     Culture   Final    NO GROWTH < 24 HOURS Performed at [facility], [address] [place], [place] [address]    Report Status PENDING  Incomplete     Time coordinating discharge: 35 minutes  SIGNED:   Glade Lloyd, MD  [facility] 11/04/2022, 9:21 AM

## 2022-11-07 LAB — CULTURE, BLOOD (ROUTINE X 2)
Culture: NO GROWTH
Culture: NO GROWTH
Special Requests: ADEQUATE
Special Requests: ADEQUATE

## 2022-11-11 ENCOUNTER — Other Ambulatory Visit: Payer: Self-pay

## 2022-11-11 ENCOUNTER — Ambulatory Visit (INDEPENDENT_AMBULATORY_CARE_PROVIDER_SITE_OTHER): Payer: Commercial Managed Care - PPO

## 2022-11-11 VITALS — BP 130/80 | HR 73 | Temp 97.9°F | Ht 66.0 in | Wt 384.0 lb

## 2022-11-11 DIAGNOSIS — Z0000 Encounter for general adult medical examination without abnormal findings: Secondary | ICD-10-CM | POA: Insufficient documentation

## 2022-11-11 DIAGNOSIS — Z23 Encounter for immunization: Secondary | ICD-10-CM

## 2022-11-11 DIAGNOSIS — F17200 Nicotine dependence, unspecified, uncomplicated: Secondary | ICD-10-CM

## 2022-11-11 DIAGNOSIS — F1010 Alcohol abuse, uncomplicated: Secondary | ICD-10-CM | POA: Diagnosis not present

## 2022-11-11 DIAGNOSIS — K5792 Diverticulitis of intestine, part unspecified, without perforation or abscess without bleeding: Secondary | ICD-10-CM

## 2022-11-11 DIAGNOSIS — F17210 Nicotine dependence, cigarettes, uncomplicated: Secondary | ICD-10-CM | POA: Diagnosis not present

## 2022-11-11 MED ORDER — NICOTINE 14 MG/24HR TD PT24: 14.0000 mg | MEDICATED_PATCH | TRANSDERMAL | 1 refills | Status: AC

## 2022-11-11 NOTE — Patient Instructions (Addendum)
Thank you for coming to see [place] in clinic Mr. Javier Bowen.   Plan: - We drew your blood today to check you red blood cells, white blood cells, kidney function, and electrolytes (we will call you with these results)  - We drew your blood to check your A1c (marker of blood sugar over the last 3 months) and cholesterol level (we will call you with these results)  - We drew your blood to screen for Hepatitis C which is recommended for all patients (we will call you with these results)  - We gave you the tetanus booster today  - We prescribed you nicoderm CQ patches to help with smoking cessation (GoodRX has good deals on these for the future)  It was very nice to see you, thank you for allowing [place] to be involved in your care.

## 2022-11-11 NOTE — Assessment & Plan Note (Signed)
Ordered basic labs and screening including Hep C screening, A1c, and lipid panel. Tdap administered today.

## 2022-11-11 NOTE — Progress Notes (Signed)
CC: new patient visit  HPI:  [name] is a 37 y.o. male with past medical history of diverticulitis, obesity, ETOH abuse, and tobacco use disorder that presents for a new patient visit.   Patient was discharged on 1/16 after being hospitalized for acute diverticulitis. Discharged on augmentin 875-125 mg BID for 7 days. Patient states that he has completed this course of antibiotics. Patient was also sent home with oxycodone which he has not needed over the last few days. Plan at discharge was to have the patient f/u w/ GI as an outpatient. He is scheduled for a colonoscopy on 2/5. Abdominal pain remains resolved. Patient denies fever, chills, nausea, vomiting, or diarrhea.   Patient has a history of ETOH abuse. He is not currently on any medications for this condition. Last drink was yesterday. Patient denies history of withdrawal symptoms or seizures. He is interested in cutting down on his drinking but would not like to start medication at this time. He recognizes the need to decrease his alcohol intake.   Patient also has a history of tobacco use disorder. Patient is not currently on any medications for this condition. Patient has been smoking 3/4 pack for 23 years. Patient is interested in quitting smoking at this time. He has tried nicotine patches previously and reports some improvement in his cravings while using them.   Patient is comfortable receiving basic labs and screening including Hep C screening, A1c, and lipid panel. Patient is agreeable to receive Tdap today.   Patient denies past medical history of HTN, DM, CHF, MI, DVT, stroke, or cancer.   Family history includes: T2DM (mother), MI (uncle in 11s), lung cancer (uncle), liver cancer (uncle). Patient denies family history of HTN, DM, CHF, DVT, stroke.    Past surgical history is negative.   Past social history: Patient lives at home with his wife and two sons. Patient works as Administrator. Patient has been smoking 3/4  pack for 23 years. Patient reports drinking 10-12 shots of tequila per day on the weekends over the last 20 years. Patient reports prior marijuana use but has not used marijuana in 5 years. Patient denies other current or prior recreational drug use.   Medications: melatonin.   Allergies as of 11/11/2022   No Known Allergies      Medication List        Accurate as of November 11, 2022 10:47 AM. If you have any questions, ask your nurse or doctor.          amoxicillin-clavulanate 875-125 MG tablet Commonly known as: AUGMENTIN Take 1 tablet by mouth 2 (two) times daily for 7 days.   oxyCODONE 5 MG immediate release tablet Commonly known as: Oxy IR/ROXICODONE Take 1-2 tablets (5-10 mg total) by mouth every 6 (six) hours as needed for moderate pain.   polyethylene glycol 17 g packet Commonly known as: MiraLax Take 17 g by mouth daily as needed for moderate constipation.         Past Medical History:  Diagnosis Date   Diverticulitis    Morbid obesity ([place])    Review of Systems:  per HPI.   Physical Exam: Vitals:   11/11/22 1036  BP: 130/80  Pulse: 73  Temp: 97.9 F (36.6 C)  TempSrc: Oral  SpO2: 99%  Weight: (!) 384 lb (174.2 kg)  Height: 5\' 6"  (1.676 m)   Constitutional: Well-developed, well-nourished, appears comfortable, pleasant HENT: Normocephalic and atraumatic.  Eyes: EOM are normal. PERRL.  Neck: Normal range of  motion.  Cardiovascular: Regular rate, regular rhythm. No murmurs, rubs, or gallops. Normal radial and PT pulses bilaterally. No LE edema.  Pulmonary: Normal respiratory effort. No wheezes, rales, or rhonchi.   Abdominal: Soft. Non-distended. No tenderness. Normal bowel sounds.  Musculoskeletal: Normal range of motion.     Neurological: Alert and oriented to person, place, and time. Non-focal. Skin: warm and dry.    Assessment & Plan:   See Encounters Tab for problem based charting.  Patient seen with Dr. Philipp Ovens

## 2022-11-11 NOTE — Assessment & Plan Note (Signed)
Patient was discharged on 1/16 after being hospitalized for acute diverticulitis. Discharged on augmentin 875-125 mg BID for 7 days. Patient states that he has completed this course of antibiotics. Patient was also sent home with oxycodone which he has not needed over the last few days. Plan at discharge was to have the patient f/u w/ GI as an outpatient. He is scheduled for a colonoscopy on 2/5. Abdominal pain remains resolved. Patient denies fever, chills, nausea, vomiting, or diarrhea.   Plan: - Repeat CBC/CMP - F/u w/ GI as outpatient

## 2022-11-11 NOTE — Assessment & Plan Note (Signed)
Patient is not currently on any medications for this condition. Patient has been smoking 3/4 pack for 23 years. Patient is interested in quitting smoking at this time. He has tried nicotine patches previously and reports some improvement in his cravings while using them.   Plan: - Prescribed Nicoderm CQ patches (w/ [employer]RX coupon)

## 2022-11-11 NOTE — Assessment & Plan Note (Signed)
Patient is not currently on any medications for this condition. Last drink was yesterday. Patient denies history of withdrawal symptoms or seizures. He is interested in cutting down on his drinking but would not like to start medication at this time. He recognizes the need to decrease his alcohol intake. Counseled on alcohol cessation.   Plan: - Continue to monitor - Could consider starting naltrexone in the future if patient is agreeable

## 2022-11-12 LAB — CMP14 + ANION GAP
ALT: 29 IU/L (ref 0–44)
AST: 19 IU/L (ref 0–40)
Albumin/Globulin Ratio: 1.9 (ref 1.2–2.2)
Albumin: 4.4 g/dL (ref 4.1–5.1)
Alkaline Phosphatase: 58 IU/L (ref 44–121)
Anion Gap: 17 mmol/L (ref 10.0–18.0)
BUN/Creatinine Ratio: 12 (ref 9–20)
BUN: 12 mg/dL (ref 6–20)
Bilirubin Total: 0.2 mg/dL (ref 0.0–1.2)
CO2: 19 mmol/L — ABNORMAL LOW (ref 20–29)
Calcium: 9.2 mg/dL (ref 8.7–10.2)
Chloride: 106 mmol/L (ref 96–106)
Creatinine, Ser: 1.02 mg/dL (ref 0.76–1.27)
Globulin, Total: 2.3 g/dL (ref 1.5–4.5)
Glucose: 98 mg/dL (ref 70–99)
Potassium: 4.7 mmol/L (ref 3.5–5.2)
Sodium: 142 mmol/L (ref 134–144)
Total Protein: 6.7 g/dL (ref 6.0–8.5)
eGFR: 98 mL/min/{1.73_m2} (ref >59)

## 2022-11-12 LAB — LIPID PANEL
Chol/HDL Ratio: 4 ratio (ref 0.0–5.0)
Cholesterol, Total: 149 mg/dL (ref 100–199)
HDL: 37 mg/dL — ABNORMAL LOW (ref >39)
LDL Chol Calc (NIH): 95 mg/dL (ref 0–99)
Triglycerides: 87 mg/dL (ref 0–149)
VLDL Cholesterol Cal: 17 mg/dL (ref 5–40)

## 2022-11-12 LAB — HEMOGLOBIN A1C
Est. average glucose Bld gHb Est-mCnc: 134 mg/dL
Hgb A1c MFr Bld: 6.3 % — ABNORMAL HIGH (ref 4.8–5.6)

## 2022-11-12 LAB — CBC WITH DIFFERENTIAL/PLATELET
Basophils Absolute: 0 10*3/uL (ref 0.0–0.2)
Basos: 0 %
EOS (ABSOLUTE): 0.3 10*3/uL (ref 0.0–0.4)
Eos: 3 %
Hematocrit: 46.9 % (ref 37.5–51.0)
Hemoglobin: 15.2 g/dL (ref 13.0–17.7)
Immature Grans (Abs): 0 10*3/uL (ref 0.0–0.1)
Immature Granulocytes: 0 %
Lymphocytes Absolute: 4.5 10*3/uL — ABNORMAL HIGH (ref 0.7–3.1)
Lymphs: 44 %
MCH: 26.7 pg (ref 26.6–33.0)
MCHC: 32.4 g/dL (ref 31.5–35.7)
MCV: 82 fL (ref 79–97)
Monocytes Absolute: 0.7 10*3/uL (ref 0.1–0.9)
Monocytes: 6 %
Neutrophils Absolute: 4.8 10*3/uL (ref 1.4–7.0)
Neutrophils: 47 %
Platelets: 314 10*3/uL (ref 150–450)
RBC: 5.69 x10E6/uL (ref 4.14–5.80)
RDW: 14.4 % (ref 11.6–15.4)
WBC: 10.3 10*3/uL (ref 3.4–10.8)

## 2022-11-12 LAB — HCV AB W REFLEX TO QUANT PCR: HCV Ab: NONREACTIVE

## 2022-11-12 LAB — HCV INTERPRETATION

## 2022-11-12 NOTE — Progress Notes (Signed)
Called patient and updated on results. Discussed that patient could start metformin to lower his A1c. Patient is hesitant to start metformin at this time. He prefers dietary modification at this time. Discussed plan to recheck A1c in 3 months and discuss metformin and alternatives for lowering his A1c if indicated. Patient agrees with this plan.

## 2022-11-12 NOTE — Progress Notes (Signed)
Called and updated on results. Discussed that HDL is low, however, otherwise cholesterol is WNL. Unable to calculate ASVD risk given age < 39. Plan to repeat lipid panel yearly to reassess need for statin therapy in the future.

## 2022-11-13 NOTE — Addendum Note (Signed)
Addended by: Starlyn Skeans on: 11/13/2022 02:27 PM   Modules accepted: Orders, Level of Service

## 2022-11-13 NOTE — Progress Notes (Signed)
[facility] Attending  Case discussed with Dr. Mapp  At the time of the visit.  We reviewed the resident's history and exam and pertinent patient test results.  I agree with the assessment, diagnosis, and plan of care documented in the resident's note.  

## 2022-11-23 ENCOUNTER — Telehealth: Payer: Self-pay | Admitting: Physician Assistant

## 2022-11-23 NOTE — Telephone Encounter (Signed)
Patient called today at 12:45 PM, says that he is scheduled for colonoscopy tomorrow at with our [facility].  He did not know the physician's name but appears this is Dr. Tarri Glenn. Says he has not received any instructions or a prep Reviewing his chart I do not see any communication or nurse visit.  Patient had been seen in the hospital for diverticulitis and then was referred for colonoscopy.  Not having any acute symptoms  Advised patient that we will cancel his colonoscopy for tomorrow, and that our office will contact him tomorrow to get him rescheduled for colonoscopy, and then have appropriate instructions for diet prep etc.  Beth, I attached you on this as I cannot remember who Dr. Tarri Glenn nurses you can forward this to Dr. Tarri Glenn nurse -thank you

## 2022-11-24 ENCOUNTER — Ambulatory Visit: Payer: Commercial Managed Care - PPO | Admitting: Gastroenterology

## 2022-11-24 NOTE — Telephone Encounter (Signed)
The good news is that the patient was scheduled for an office visit as a new patient. The appointment has been rescheduled by office staff.

## 2022-11-24 NOTE — Telephone Encounter (Signed)
Thank you :)

## 2023-01-02 ENCOUNTER — Ambulatory Visit: Payer: Commercial Managed Care - PPO | Admitting: Gastroenterology

## 2023-01-09 ENCOUNTER — Encounter: Payer: Commercial Managed Care - PPO | Admitting: Student
